# Patient Record
Sex: Male | Born: 1995 | Race: Black or African American | Hispanic: No | Marital: Single | Smoking: Never smoker
Health system: Southern US, Community
[De-identification: ages and names within clinical notes are randomized; demographics above are authoritative.]

## PROBLEM LIST (undated history)

## (undated) HISTORY — PX: FRACTURE SURGERY: SHX138

---

## 2018-07-17 ENCOUNTER — Emergency Department (HOSPITAL_COMMUNITY): Payer: BLUE CROSS/BLUE SHIELD

## 2018-07-17 ENCOUNTER — Encounter (HOSPITAL_COMMUNITY): Payer: Self-pay

## 2018-07-17 ENCOUNTER — Other Ambulatory Visit: Payer: Self-pay

## 2018-07-17 ENCOUNTER — Observation Stay (HOSPITAL_COMMUNITY): Payer: BLUE CROSS/BLUE SHIELD | Admitting: Registered Nurse

## 2018-07-17 ENCOUNTER — Observation Stay (HOSPITAL_COMMUNITY)
Admission: EM | Admit: 2018-07-17 | Discharge: 2018-07-18 | Disposition: A | Discharge status: Home / Self Care | Payer: BLUE CROSS/BLUE SHIELD | Attending: General Surgery | Admitting: General Surgery

## 2018-07-17 ENCOUNTER — Encounter (HOSPITAL_COMMUNITY)
Admission: EM | Disposition: A | Discharge status: Home / Self Care | Payer: Self-pay | Source: Home / Self Care | Attending: Emergency Medicine

## 2018-07-17 DIAGNOSIS — K3580 Unspecified acute appendicitis: Principal | ICD-10-CM | POA: Diagnosis present

## 2018-07-17 HISTORY — PX: LAPAROSCOPIC APPENDECTOMY: SHX408

## 2018-07-17 LAB — URINALYSIS, ROUTINE W REFLEX MICROSCOPIC
Bilirubin Urine: NEGATIVE
Glucose, UA: NEGATIVE mg/dL
Hgb urine dipstick: NEGATIVE
Ketones, ur: NEGATIVE mg/dL
Leukocytes,Ua: NEGATIVE
NITRITE: NEGATIVE
Protein, ur: NEGATIVE mg/dL
Specific Gravity, Urine: 1.031 — ABNORMAL HIGH (ref 1.005–1.030)
pH: 8 (ref 5.0–8.0)

## 2018-07-17 LAB — CBC
HCT: 43.4 % (ref 39.0–52.0)
Hemoglobin: 13.6 g/dL (ref 13.0–17.0)
MCH: 28.3 pg (ref 26.0–34.0)
MCHC: 31.3 g/dL (ref 30.0–36.0)
MCV: 90.4 fL (ref 80.0–100.0)
Platelets: 225 10*3/uL (ref 150–400)
RBC: 4.8 MIL/uL (ref 4.22–5.81)
RDW: 13.1 % (ref 11.5–15.5)
WBC: 14.4 10*3/uL — ABNORMAL HIGH (ref 4.0–10.5)
nRBC: 0 % (ref 0.0–0.2)

## 2018-07-17 LAB — BASIC METABOLIC PANEL
Anion gap: 7 (ref 5–15)
BUN: 15 mg/dL (ref 6–20)
CO2: 26 mmol/L (ref 22–32)
Calcium: 9.7 mg/dL (ref 8.9–10.3)
Chloride: 107 mmol/L (ref 98–111)
Creatinine, Ser: 1.08 mg/dL (ref 0.61–1.24)
GFR calc Af Amer: >60 mL/min (ref >60)
GFR calc non Af Amer: >60 mL/min (ref >60)
Glucose, Bld: 118 mg/dL — ABNORMAL HIGH (ref 70–99)
Potassium: 3.8 mmol/L (ref 3.5–5.1)
Sodium: 140 mmol/L (ref 135–145)

## 2018-07-17 SURGERY — APPENDECTOMY, LAPAROSCOPIC
Anesthesia: General | Site: Abdomen

## 2018-07-17 MED ORDER — MEPERIDINE HCL 50 MG/ML IJ SOLN: 6.2500 mg | INTRAMUSCULAR | Status: DC | PRN

## 2018-07-17 MED ORDER — MEPERIDINE HCL 50 MG/ML IJ SOLN
INTRAMUSCULAR | Status: AC
  Filled 2018-07-17: qty 1

## 2018-07-17 MED ORDER — PROPOFOL 10 MG/ML IV BOLUS
INTRAVENOUS | Status: DC | PRN
  Administered 2018-07-17: 170 mg via INTRAVENOUS
  Administered 2018-07-17: 30 mg via INTRAVENOUS

## 2018-07-17 MED ORDER — INFLUENZA VAC SPLIT QUAD 0.5 ML IM SUSY: 0.5000 mL | PREFILLED_SYRINGE | INTRAMUSCULAR | Status: DC

## 2018-07-17 MED ORDER — PROPOFOL 10 MG/ML IV BOLUS
INTRAVENOUS | Status: AC
  Filled 2018-07-17: qty 20

## 2018-07-17 MED ORDER — FENTANYL CITRATE (PF) 250 MCG/5ML IJ SOLN
INTRAMUSCULAR | Status: DC | PRN
  Administered 2018-07-17: 150 ug via INTRAVENOUS
  Administered 2018-07-17 (×2): 50 ug via INTRAVENOUS

## 2018-07-17 MED ORDER — HYDROMORPHONE HCL 1 MG/ML IJ SOLN
1.0000 mg | Freq: Once | INTRAMUSCULAR | Status: AC
  Administered 2018-07-17: 1 mg via INTRAVENOUS
  Filled 2018-07-17: qty 1

## 2018-07-17 MED ORDER — SUGAMMADEX SODIUM 200 MG/2ML IV SOLN
INTRAVENOUS | Status: DC | PRN
  Administered 2018-07-17: 200 mg via INTRAVENOUS

## 2018-07-17 MED ORDER — DIPHENHYDRAMINE HCL 25 MG PO CAPS: 25.0000 mg | ORAL_CAPSULE | Freq: Four times a day (QID) | ORAL | Status: DC | PRN

## 2018-07-17 MED ORDER — PROMETHAZINE HCL 25 MG/ML IJ SOLN: 6.2500 mg | INTRAMUSCULAR | Status: DC | PRN

## 2018-07-17 MED ORDER — ONDANSETRON HCL 4 MG/2ML IJ SOLN
4.0000 mg | Freq: Once | INTRAMUSCULAR | Status: AC
  Administered 2018-07-17: 4 mg via INTRAVENOUS
  Filled 2018-07-17: qty 2

## 2018-07-17 MED ORDER — SUGAMMADEX SODIUM 200 MG/2ML IV SOLN
INTRAVENOUS | Status: AC
  Filled 2018-07-17: qty 2

## 2018-07-17 MED ORDER — HYDROMORPHONE HCL 1 MG/ML IJ SOLN
INTRAMUSCULAR | Status: AC
  Administered 2018-07-17: 0.25 mg via INTRAVENOUS
  Filled 2018-07-17: qty 1

## 2018-07-17 MED ORDER — METOPROLOL TARTRATE 5 MG/5ML IV SOLN: 5.0000 mg | Freq: Four times a day (QID) | INTRAVENOUS | Status: DC | PRN

## 2018-07-17 MED ORDER — DEXAMETHASONE SODIUM PHOSPHATE 10 MG/ML IJ SOLN
INTRAMUSCULAR | Status: DC | PRN
  Administered 2018-07-17: 10 mg via INTRAVENOUS

## 2018-07-17 MED ORDER — ACETAMINOPHEN 650 MG RE SUPP: 650.0000 mg | Freq: Four times a day (QID) | RECTAL | Status: DC | PRN

## 2018-07-17 MED ORDER — RINGERS IRRIGATION IR SOLN
Status: DC | PRN
  Administered 2018-07-17: 1

## 2018-07-17 MED ORDER — KCL IN DEXTROSE-NACL 20-5-0.45 MEQ/L-%-% IV SOLN
INTRAVENOUS | Status: DC
  Administered 2018-07-17: 21:00:00 via INTRAVENOUS
  Filled 2018-07-17: qty 1000

## 2018-07-17 MED ORDER — SUCCINYLCHOLINE CHLORIDE 200 MG/10ML IV SOSY
PREFILLED_SYRINGE | INTRAVENOUS | Status: DC | PRN
  Administered 2018-07-17: 140 mg via INTRAVENOUS

## 2018-07-17 MED ORDER — BUPIVACAINE-EPINEPHRINE 0.25% -1:200000 IJ SOLN
INTRAMUSCULAR | Status: DC | PRN
  Administered 2018-07-17: 30 mL

## 2018-07-17 MED ORDER — SODIUM CHLORIDE 0.9 % IV BOLUS
1000.0000 mL | Freq: Once | INTRAVENOUS | Status: AC
  Administered 2018-07-17: 1000 mL via INTRAVENOUS

## 2018-07-17 MED ORDER — LIDOCAINE 2% (20 MG/ML) 5 ML SYRINGE
INTRAMUSCULAR | Status: DC | PRN
  Administered 2018-07-17: 60 mg via INTRAVENOUS

## 2018-07-17 MED ORDER — ROCURONIUM BROMIDE 10 MG/ML (PF) SYRINGE
PREFILLED_SYRINGE | INTRAVENOUS | Status: DC | PRN
  Administered 2018-07-17: 40 mg via INTRAVENOUS

## 2018-07-17 MED ORDER — ACETAMINOPHEN 325 MG PO TABS: 650.0000 mg | ORAL_TABLET | Freq: Four times a day (QID) | ORAL | Status: DC | PRN

## 2018-07-17 MED ORDER — LIDOCAINE 2% (20 MG/ML) 5 ML SYRINGE
INTRAMUSCULAR | Status: AC
  Filled 2018-07-17: qty 5

## 2018-07-17 MED ORDER — ACETAMINOPHEN 10 MG/ML IV SOLN
1000.0000 mg | Freq: Once | INTRAVENOUS | Status: DC | PRN
  Administered 2018-07-17: 1000 mg via INTRAVENOUS

## 2018-07-17 MED ORDER — DEXAMETHASONE SODIUM PHOSPHATE 10 MG/ML IJ SOLN
INTRAMUSCULAR | Status: AC
  Filled 2018-07-17: qty 1

## 2018-07-17 MED ORDER — LACTATED RINGERS IV SOLN: INTRAVENOUS | Status: DC

## 2018-07-17 MED ORDER — MIDAZOLAM HCL 2 MG/2ML IJ SOLN
INTRAMUSCULAR | Status: AC
  Filled 2018-07-17: qty 2

## 2018-07-17 MED ORDER — ACETAMINOPHEN 325 MG PO TABS: 325.0000 mg | ORAL_TABLET | Freq: Once | ORAL | Status: DC

## 2018-07-17 MED ORDER — ROCURONIUM BROMIDE 10 MG/ML (PF) SYRINGE
PREFILLED_SYRINGE | INTRAVENOUS | Status: AC
  Filled 2018-07-17: qty 10

## 2018-07-17 MED ORDER — ONDANSETRON HCL 4 MG/2ML IJ SOLN
INTRAMUSCULAR | Status: DC | PRN
  Administered 2018-07-17: 4 mg via INTRAVENOUS

## 2018-07-17 MED ORDER — DIPHENHYDRAMINE HCL 50 MG/ML IJ SOLN: 25.0000 mg | Freq: Four times a day (QID) | INTRAMUSCULAR | Status: DC | PRN

## 2018-07-17 MED ORDER — FENTANYL CITRATE (PF) 250 MCG/5ML IJ SOLN
INTRAMUSCULAR | Status: AC
  Filled 2018-07-17: qty 5

## 2018-07-17 MED ORDER — SUCCINYLCHOLINE CHLORIDE 200 MG/10ML IV SOSY
PREFILLED_SYRINGE | INTRAVENOUS | Status: AC
  Filled 2018-07-17: qty 10

## 2018-07-17 MED ORDER — ONDANSETRON 4 MG PO TBDP: 4.0000 mg | ORAL_TABLET | Freq: Four times a day (QID) | ORAL | Status: DC | PRN

## 2018-07-17 MED ORDER — MIDAZOLAM HCL 5 MG/5ML IJ SOLN
INTRAMUSCULAR | Status: DC | PRN
  Administered 2018-07-17: 2 mg via INTRAVENOUS

## 2018-07-17 MED ORDER — ACETAMINOPHEN 160 MG/5ML PO SOLN: 325.0000 mg | Freq: Once | ORAL | Status: DC

## 2018-07-17 MED ORDER — OXYCODONE HCL 5 MG PO TABS: 5.0000 mg | ORAL_TABLET | Freq: Four times a day (QID) | ORAL | Status: DC | PRN

## 2018-07-17 MED ORDER — SODIUM CHLORIDE 0.9 % IV SOLN
INTRAVENOUS | Status: DC
  Administered 2018-07-17 (×2): via INTRAVENOUS

## 2018-07-17 MED ORDER — BUPIVACAINE-EPINEPHRINE (PF) 0.25% -1:200000 IJ SOLN
INTRAMUSCULAR | Status: AC
  Filled 2018-07-17: qty 30

## 2018-07-17 MED ORDER — MORPHINE SULFATE (PF) 2 MG/ML IV SOLN
2.0000 mg | INTRAVENOUS | Status: DC | PRN
  Administered 2018-07-17 – 2018-07-18 (×2): 2 mg via INTRAVENOUS
  Filled 2018-07-17 (×2): qty 1

## 2018-07-17 MED ORDER — METRONIDAZOLE IN NACL 5-0.79 MG/ML-% IV SOLN
500.0000 mg | Freq: Three times a day (TID) | INTRAVENOUS | Status: DC
  Administered 2018-07-17: 500 mg via INTRAVENOUS
  Filled 2018-07-17: qty 100

## 2018-07-17 MED ORDER — SODIUM CHLORIDE 0.9 % IV SOLN
2.0000 g | INTRAVENOUS | Status: DC
  Administered 2018-07-17: 2 g via INTRAVENOUS
  Filled 2018-07-17: qty 20

## 2018-07-17 MED ORDER — ONDANSETRON HCL 4 MG/2ML IJ SOLN: 4.0000 mg | Freq: Four times a day (QID) | INTRAMUSCULAR | Status: DC | PRN

## 2018-07-17 MED ORDER — ACETAMINOPHEN 10 MG/ML IV SOLN
INTRAVENOUS | Status: AC
  Administered 2018-07-17: 1000 mg via INTRAVENOUS
  Filled 2018-07-17: qty 100

## 2018-07-17 MED ORDER — ONDANSETRON HCL 4 MG/2ML IJ SOLN
INTRAMUSCULAR | Status: AC
  Filled 2018-07-17: qty 2

## 2018-07-17 MED ORDER — POLYETHYLENE GLYCOL 3350 17 G PO PACK: 17.0000 g | PACK | Freq: Every day | ORAL | Status: DC | PRN

## 2018-07-17 MED ORDER — HYDROMORPHONE HCL 1 MG/ML IJ SOLN
0.2500 mg | INTRAMUSCULAR | Status: DC | PRN
  Administered 2018-07-17 (×2): 0.25 mg via INTRAVENOUS

## 2018-07-17 MED ORDER — SODIUM CHLORIDE 0.9 % IR SOLN
Status: DC | PRN
  Administered 2018-07-17: 1000 mL

## 2018-07-17 SURGICAL SUPPLY — 42 items
APPLIER CLIP 5 13 M/L LIGAMAX5 (MISCELLANEOUS)
APPLIER CLIP ROT 10 11.4 M/L (STAPLE)
BANDAGE ADH SHEER 1  50/CT (GAUZE/BANDAGES/DRESSINGS) IMPLANT
BENZOIN TINCTURE PRP APPL 2/3 (GAUZE/BANDAGES/DRESSINGS) ×6 IMPLANT
CABLE HIGH FREQUENCY MONO STRZ (ELECTRODE) ×6 IMPLANT
CLIP APPLIE 5 13 M/L LIGAMAX5 (MISCELLANEOUS) IMPLANT
CLIP APPLIE ROT 10 11.4 M/L (STAPLE) IMPLANT
CLIP VESOLOCK XL 6/CT (CLIP) ×6 IMPLANT
CLOSURE WOUND 1/2 X4 (GAUZE/BANDAGES/DRESSINGS)
CLOSURE WOUND 1/4X4 (GAUZE/BANDAGES/DRESSINGS)
COVER SURGICAL LIGHT HANDLE (MISCELLANEOUS) ×3 IMPLANT
COVER WAND RF STERILE (DRAPES) IMPLANT
DECANTER SPIKE VIAL GLASS SM (MISCELLANEOUS) ×3 IMPLANT
DRAIN CHANNEL 19F RND (DRAIN) IMPLANT
DRAPE LAPAROSCOPIC ABDOMINAL (DRAPES) ×3 IMPLANT
ELECT REM PT RETURN 15FT ADLT (MISCELLANEOUS) ×3 IMPLANT
ENDOLOOP SUT PDS II  0 18 (SUTURE)
ENDOLOOP SUT PDS II 0 18 (SUTURE) IMPLANT
EVACUATOR SILICONE 100CC (DRAIN) IMPLANT
GLOVE BIOGEL PI IND STRL 7.0 (GLOVE) ×1 IMPLANT
GLOVE BIOGEL PI INDICATOR 7.0 (GLOVE) ×2
GLOVE SURG SS PI 7.0 STRL IVOR (GLOVE) ×3 IMPLANT
GOWN STRL REUS W/TWL LRG LVL3 (GOWN DISPOSABLE) ×3 IMPLANT
GOWN STRL REUS W/TWL XL LVL3 (GOWN DISPOSABLE) IMPLANT
GRASPER SUT TROCAR 14GX15 (MISCELLANEOUS) IMPLANT
KIT BASIN OR (CUSTOM PROCEDURE TRAY) ×3 IMPLANT
KIT TURNOVER KIT A (KITS) IMPLANT
POUCH RETRIEVAL ECOSAC 10 (ENDOMECHANICALS) ×2 IMPLANT
POUCH RETRIEVAL ECOSAC 10MM (ENDOMECHANICALS) ×4
SCISSORS LAP 5X35 DISP (ENDOMECHANICALS) ×3 IMPLANT
SET IRRIG TUBING LAPAROSCOPIC (IRRIGATION / IRRIGATOR) ×3 IMPLANT
SET TUBE SMOKE EVAC HIGH FLOW (TUBING) ×3 IMPLANT
SLEEVE XCEL OPT CAN 5 100 (ENDOMECHANICALS) ×3 IMPLANT
STRIP CLOSURE SKIN 1/2X4 (GAUZE/BANDAGES/DRESSINGS) IMPLANT
STRIP CLOSURE SKIN 1/4X4 (GAUZE/BANDAGES/DRESSINGS) IMPLANT
SUT ETHILON 2 0 PS N (SUTURE) IMPLANT
SUT MNCRL AB 4-0 PS2 18 (SUTURE) ×3 IMPLANT
TOWEL OR 17X26 10 PK STRL BLUE (TOWEL DISPOSABLE) ×3 IMPLANT
TOWEL OR NON WOVEN STRL DISP B (DISPOSABLE) ×3 IMPLANT
TRAY LAPAROSCOPIC (CUSTOM PROCEDURE TRAY) ×3 IMPLANT
TROCAR BLADELESS OPT 5 100 (ENDOMECHANICALS) ×3 IMPLANT
TROCAR XCEL 12X100 BLDLESS (ENDOMECHANICALS) ×3 IMPLANT

## 2018-07-17 NOTE — Plan of Care (Signed)
  Problem: Education: Goal: Knowledge of General Education information will improve Description: Including pain rating scale, medication(s)/side effects and non-pharmacologic comfort measures Outcome: Progressing   Problem: Clinical Measurements: Goal: Respiratory complications will improve Outcome: Progressing   Problem: Clinical Measurements: Goal: Cardiovascular complication will be avoided Outcome: Progressing   

## 2018-07-17 NOTE — Anesthesia Preprocedure Evaluation (Signed)
Anesthesia Evaluation  Patient identified by MRN, date of birth, ID band Patient awake    Reviewed: Allergy & Precautions, NPO status , Patient's Chart, lab work & pertinent test results  Airway Mallampati: I  TM Distance: >3 FB Neck ROM: Full    Dental  (+) Teeth Intact, Dental Advisory Given   Pulmonary neg pulmonary ROS,    breath sounds clear to auscultation       Cardiovascular negative cardio ROS   Rhythm:Regular Rate:Normal     Neuro/Psych negative neurological ROS     GI/Hepatic negative GI ROS, Neg liver ROS,   Endo/Other  negative endocrine ROS  Renal/GU negative Renal ROS     Musculoskeletal negative musculoskeletal ROS (+)   Abdominal Normal abdominal exam  (+)   Peds  Hematology negative hematology ROS (+)   Anesthesia Other Findings   Reproductive/Obstetrics                             Anesthesia Physical Anesthesia Plan  ASA: I and emergent  Anesthesia Plan: General   Post-op Pain Management:    Induction: Intravenous, Rapid sequence and Cricoid pressure planned  PONV Risk Score and Plan: 3 and Ondansetron, Dexamethasone and Midazolam  Airway Management Planned: Oral ETT  Additional Equipment: None  Intra-op Plan:   Post-operative Plan: Extubation in OR  Informed Consent:   Plan Discussed with: CRNA  Anesthesia Plan Comments:         Anesthesia Quick Evaluation

## 2018-07-17 NOTE — ED Triage Notes (Signed)
Transported by [facility] from [facility]--onset of RLQ pain and right flank pain that started this morning at 4 am. +n/v.

## 2018-07-17 NOTE — Progress Notes (Signed)
Pt stable to floor on arrival from PACU. No needs a time of arrival to floor. VS stable overall and abdominal ports wnl.

## 2018-07-17 NOTE — ED Notes (Signed)
Surgeon has not come down to talk to patient about surgery, therefore, consent has not been signed

## 2018-07-17 NOTE — ED Provider Notes (Signed)
[name] Provider Note   CSN: [id number] Arrival date & time: 07/17/18  1126    History   Chief Complaint Chief Complaint  Patient presents with  . Abdominal Pain    HPI Shaun Freeman is a 23 y.o. male.     Patient with acute onset of right lower quadrant this morning at about 4 in the morning.  Felt fine yesterday.  Pain is severe and associated with some diaphoresis and nausea and vomiting and dry heaves.  No history of kidney stones.  No blood in the urine no difficulty urinating no back pain.  Past medical history noncontributory.     History reviewed. No pertinent past medical history.  Patient Active Problem List   Diagnosis Date Noted  . Acute appendicitis 07/17/2018    History reviewed. No pertinent surgical history.      Home Medications    Prior to Admission medications   Not on File    Family History History reviewed. No pertinent family history.  Social History Social History   Tobacco Use  . Smoking status: Not on file  Substance Use Topics  . Alcohol use: Not on file  . Drug use: Not on file     Allergies   Patient has no known allergies.   Review of Systems Review of Systems  Constitutional: Negative for chills and fever.  HENT: Negative for rhinorrhea and sore throat.   Eyes: Negative for visual disturbance.  Respiratory: Negative for cough and shortness of breath.   Cardiovascular: Negative for chest pain and leg swelling.  Gastrointestinal: Positive for abdominal pain, nausea and vomiting. Negative for diarrhea.  Genitourinary: Negative for dysuria.  Musculoskeletal: Negative for back pain and neck pain.  Skin: Negative for rash.  Neurological: Negative for dizziness, light-headedness and headaches.  Hematological: Does not bruise/bleed easily.  Psychiatric/Behavioral: Negative for confusion.     Physical Exam Updated Vital Signs BP (!) 169/56   Pulse 61   Temp 97.7 F (36.5 C) (Oral)    Resp 16   Ht 1.854 m ( )   Wt 84.8 kg   SpO2 100%   BMI 24.67 kg/m   Physical Exam Vitals signs and nursing note reviewed.  Constitutional:      Appearance: He is well-developed.  HENT:     Head: Normocephalic and atraumatic.     Mouth/Throat:     Mouth: Mucous membranes are moist.  Eyes:     Conjunctiva/sclera: Conjunctivae normal.     Pupils: Pupils are equal, round, and reactive to light.  Neck:     Musculoskeletal: Normal range of motion and neck supple.  Cardiovascular:     Rate and Rhythm: Normal rate and regular rhythm.     Heart sounds: No murmur.  Pulmonary:     Effort: Pulmonary effort is normal. No respiratory distress.     Breath sounds: Normal breath sounds.  Abdominal:     Palpations: Abdomen is soft.     Tenderness: There is abdominal tenderness.     Comments: Tender to palpation right lower quadrant.  Musculoskeletal: Normal range of motion.        General: No swelling.  Skin:    General: Skin is warm and dry.     Capillary Refill: Capillary refill takes less than 2 seconds.  Neurological:     General: No focal deficit present.     Mental Status: He is alert and oriented to person, place, and time.      ED Treatments /  Results  Labs (all labs ordered are listed, but only abnormal results are displayed) Labs Reviewed  CBC - Abnormal; Notable for the following components:      Result Value   WBC 14.4 (*)    All other components within normal limits  BASIC METABOLIC PANEL - Abnormal; Notable for the following components:   Glucose, Bld 118 (*)    All other components within normal limits  URINALYSIS, ROUTINE W REFLEX MICROSCOPIC - Abnormal; Notable for the following components:   Specific Gravity, Urine 1.031 (*)    All other components within normal limits  HIV ANTIBODY (ROUTINE TESTING W REFLEX)    EKG None  Radiology Ct Renal Stone Study  Result Date: 07/17/2018 CLINICAL DATA:  Onset right flank and right lower quadrant pain at 4  a.m. this morning with nausea and vomiting. EXAM: CT ABDOMEN AND PELVIS WITHOUT CONTRAST TECHNIQUE: Multidetector CT imaging of the abdomen and pelvis was performed following the standard protocol without IV contrast. COMPARISON:  None. FINDINGS: Lower chest: Lung bases are clear. No pleural or pericardial effusion. Hepatobiliary: No focal liver abnormality is seen. No gallstones, gallbladder wall thickening, or biliary dilatation. Pancreas: Unremarkable. No pancreatic ductal dilatation or surrounding inflammatory changes. Spleen: Normal in size without focal abnormality. Adrenals/Urinary Tract: Adrenal glands are unremarkable. Kidneys are normal, without renal calculi, focal lesion, or hydronephrosis. Bladder is unremarkable. Stomach/Bowel: The appendix is dilated with periappendiceal inflammatory change. Two appendicoliths are identified; the larger measures 0.7 cm in diameter. The stomach and small and large bowel appear normal. Vascular/Lymphatic: No significant vascular findings are present. No enlarged abdominal or pelvic lymph nodes. Reproductive: Prostate is unremarkable. Other: Trace amount of free pelvic fluid noted. Musculoskeletal: Negative. IMPRESSION: Acute appendicitis. These results were called by telephone at the time of interpretation on 07/17/2018 at 1:53 pm to Dr. Vanetta Mulders , who verbally acknowledged these results. Electronically Signed   By: Drusilla Kanner M.D.   On: 07/17/2018 13:56    Procedures Procedures (including critical care time)  Medications Ordered in ED Medications  0.9 %  sodium chloride infusion ( Intravenous New Bag/Given 07/17/18 1607)  dextrose 5 % and 0.45 % NaCl with KCl 20 mEq/L infusion (has no administration in time range)  acetaminophen (TYLENOL) tablet 650 mg ( Oral MAR Hold 07/17/18 1625)    Or  acetaminophen (TYLENOL) suppository 650 mg ( Rectal MAR Hold 07/17/18 1625)  oxyCODONE (Oxy IR/ROXICODONE) immediate release tablet 5 mg ( Oral MAR Hold 07/17/18  1625)  morphine 2 MG/ML injection 2 mg ( Intravenous MAR Hold 07/17/18 1625)  polyethylene glycol (MIRALAX / GLYCOLAX) packet 17 g ( Oral MAR Hold 07/17/18 1625)  ondansetron (ZOFRAN-ODT) disintegrating tablet 4 mg ( Oral MAR Hold 07/17/18 1625)    Or  ondansetron (ZOFRAN) injection 4 mg ( Intravenous MAR Hold 07/17/18 1625)  metoprolol tartrate (LOPRESSOR) injection 5 mg ( Intravenous MAR Hold 07/17/18 1625)  cefTRIAXone (ROCEPHIN) 2 g in sodium chloride 0.9 % 100 mL IVPB ( Intravenous Automatically Held 07/25/18 1600)    And  metroNIDAZOLE (FLAGYL) IVPB 500 mg ( Intravenous Automatically Held 07/25/18 1600)  diphenhydrAMINE (BENADRYL) capsule 25 mg ( Oral MAR Hold 07/17/18 1625)    Or  diphenhydrAMINE (BENADRYL) injection 25 mg ( Intravenous MAR Hold 07/17/18 1625)  sodium chloride irrigation 0.9 % (1,000 mLs Irrigation Given 07/17/18 1652)  ringers irrigation (1 application Irrigation Given 07/17/18 1652)  ondansetron (ZOFRAN) injection 4 mg (4 mg Intravenous Given 07/17/18 1303)  sodium chloride 0.9 % bolus 1,000 mL (  1,000 mLs Intravenous New Bag/Given (Non-Interop) 07/17/18 1306)  HYDROmorphone (DILAUDID) injection 1 mg (1 mg Intravenous Given 07/17/18 1303)     Initial Impression / Assessment and Plan / ED Course  I have reviewed the triage vital signs and the nursing notes.  Pertinent labs & imaging results that were available during my care of the patient were reviewed by me and considered in my medical decision making (see chart for details).       Patient CT scan consistent with acute appendicitis without any abscess or rupture.  General surgery contacted they will take patient to the operating room.  Clinically thought patient would maybe have a kidney stone because of the severity of the pain and the the sudden onset of the right lower quadrant domino pain.  But CT scan showed that it is consistent with acute appendicitis.   Final Clinical Impressions(s) / ED Diagnoses   Final  diagnoses:  Acute appendicitis, unspecified acute appendicitis type    ED Discharge Orders    None       Vanetta Mulders, MD 07/17/18 1701

## 2018-07-17 NOTE — Op Note (Signed)
Preoperative diagnosis: acute appendicitis with peritonitis  Postoperative diagnosis: Same   Procedure: laparoscopic appendectomy  Surgeon: Feliciana Rossetti, M.D.  Asst: none  Anesthesia: Gen.   Indications for procedure: Shaun Freeman is a 23 y.o. male with symptoms of pain in right lower quadrant consistent with acute appendicitis. Confirmed by CT scan and laboratory values.  Description of procedure: The patient was brought into the operative suite, placed supine. Anesthesia was administered with endotracheal tube. The patient's left arm was tucked. All pressure points were offloaded by foam padding. The patient was prepped and draped in the usual sterile fashion.  A transverse incision was made to the left of the umbilicus and a 53mm trocar was [place]. Pneumoperitoneum was applied with high flow low pressure.  2 26mm trocars were placed, one in the suprapubic space, one in the LLQ, the periumbilical incision was then up-sized and a 53mm trocar placed in that space. A transversus abdominal block was placed on the left and right sides. Next, the patient was placed in trendelenberg, rotated to the left. The omentum was retracted cephalad. The cecum and appendix were identified. The appendix was inflamed and distended but no signs of rupture. The base of the appendix was dissected and a window through the mesoappendix was created with blunt dissection. Large Hem-o-lock clips were used to doubly ligate the base of the appendix and mesoappendix. The appendix was cut free with scissors. 2 clips were used on the mesoappendix.  The appendix was placed in a specimen bag. The pelvis and RLQ were irrigated. The appendix was removed via the umbilicus. 0 vicryl was used to close the fascial defect. Pneumoperitoneum was removed, all trocars were removed. All incisions were closed with 4-0 monocryl subcuticular stitch. The patient woke from anesthesia and was brought to PACU in stable condition.  Findings:  acute appendicitis  Specimen: appendix  Blood loss: 30 ml  Local anesthesia: 30 ml marcaine  Complications: none  Images:     Feliciana Rossetti, M.D. General, Bariatric, & Minimally Invasive Surgery [facility]

## 2018-07-17 NOTE — Transfer of Care (Signed)
Immediate Anesthesia Transfer of Care Note  Patient: Shaun Freeman  Procedure(s) Performed: APPENDECTOMY LAPAROSCOPIC (N/A Abdomen)  Patient Location: PACU  Anesthesia Type:General  Level of Consciousness: sedated  Airway & Oxygen Therapy: Patient Spontanous Breathing and Patient connected to face mask oxygen  Post-op Assessment: Report given to RN and Post -op Vital signs reviewed and stable  Post vital signs: Reviewed and stable  Last Vitals:  Vitals Value Taken Time  BP    Temp    Pulse 80 07/17/2018  5:26 PM  Resp 20 07/17/2018  5:26 PM  SpO2 100 % 07/17/2018  5:26 PM  Vitals shown include unvalidated device data.  Last Pain:  Vitals:   07/17/18 1357  TempSrc:   PainSc: 3          Complications: No apparent anesthesia complications

## 2018-07-17 NOTE — ED Notes (Signed)
Bed: YK59 Expected date:  Expected time:  Means of arrival:  Comments: EMS Flank/groin pain

## 2018-07-17 NOTE — H&P (Signed)
Reason for Consult: abdominal pain Referring Physician: Vanetta Mulders  Shaun Freeman is an 23 y.o. male.  HPI: 23 yo male woke up from pain this morning. Pain is in right lower quadrant. It is constant. It does not radiate. He had nausea earlier and vomited one time. He denies fevers, chills, or diarrhea. Pain is worse with movement and he has trouble standing straight up. Yesterday he was fine and ran 3 miles and did 100 push ups.  History reviewed. No pertinent past medical history.  History reviewed. No pertinent surgical history.  History reviewed. No pertinent family history.  Social History:  has no history on file for tobacco, alcohol, and drug.  Allergies: No Known Allergies  Medications: I have reviewed the patient's current medications.  Results for orders placed or performed during the hospital encounter of 07/17/18 (from the past 48 hour(s))  CBC     Status: Abnormal   Collection Time: 07/17/18  1:08 PM  Result Value Ref Range   WBC 14.4 (H) 4.0 - 10.5 K/uL   RBC 4.80 4.22 - 5.81 MIL/uL   Hemoglobin 13.6 13.0 - 17.0 g/dL   HCT 49.4 49.6 - 75.9 %   MCV 90.4 80.0 - 100.0 fL   MCH 28.3 26.0 - 34.0 pg   MCHC 31.3 30.0 - 36.0 g/dL   RDW 16.3 84.6 - 65.9 %   Platelets 225 150 - 400 K/uL   nRBC 0.0 0.0 - 0.2 %    Comment: Performed at [facility], [address] [place], [place] [address]  Basic metabolic panel     Status: Abnormal   Collection Time: 07/17/18  1:08 PM  Result Value Ref Range   Sodium 140 135 - 145 mmol/L   Potassium 3.8 3.5 - 5.1 mmol/L   Chloride 107 98 - 111 mmol/L   CO2 26 22 - 32 mmol/L   Glucose, Bld 118 (H) 70 - 99 mg/dL   BUN 15 6 - 20 mg/dL   Creatinine, Ser 1.77 0.61 - 1.24 mg/dL   Calcium 9.7 8.9 - 93.9 mg/dL   GFR calc non Af Amer >60 >60 mL/min   GFR calc Af Amer >60 >60 mL/min   Anion gap 7 5 - 15    Comment: Performed at [facility], [address] [place], [place] [address]   Urinalysis, Routine w reflex microscopic     Status: Abnormal   Collection Time: 07/17/18  1:08 PM  Result Value Ref Range   Color, Urine YELLOW YELLOW   APPearance CLEAR CLEAR   Specific Gravity, Urine 1.031 (H) 1.005 - 1.030   pH 8.0 5.0 - 8.0   Glucose, UA NEGATIVE NEGATIVE mg/dL   Hgb urine dipstick NEGATIVE NEGATIVE   Bilirubin Urine NEGATIVE NEGATIVE   Ketones, ur NEGATIVE NEGATIVE mg/dL   Protein, ur NEGATIVE NEGATIVE mg/dL   Nitrite NEGATIVE NEGATIVE   Leukocytes,Ua NEGATIVE NEGATIVE    Comment: Performed at [facility], [address], [place], [place] [address]    Ct Renal Stone Study  Result Date: 07/17/2018 CLINICAL DATA:  Onset right flank and right lower quadrant pain at 4 a.m. this morning with nausea and vomiting. EXAM: CT ABDOMEN AND PELVIS WITHOUT CONTRAST TECHNIQUE: Multidetector CT imaging of the abdomen and pelvis was performed following the standard protocol without IV contrast. COMPARISON:  None. FINDINGS: Lower chest: Lung bases are clear. No pleural or pericardial effusion. Hepatobiliary: No focal liver abnormality is seen. No gallstones, gallbladder wall thickening,  or biliary dilatation. Pancreas: Unremarkable. No pancreatic ductal dilatation or surrounding inflammatory changes. Spleen: Normal in size without focal abnormality. Adrenals/Urinary Tract: Adrenal glands are unremarkable. Kidneys are normal, without renal calculi, focal lesion, or hydronephrosis. Bladder is unremarkable. Stomach/Bowel: The appendix is dilated with periappendiceal inflammatory change. Two appendicoliths are identified; the larger measures 0.7 cm in diameter. The stomach and small and large bowel appear normal. Vascular/Lymphatic: No significant vascular findings are present. No enlarged abdominal or pelvic lymph nodes. Reproductive: Prostate is unremarkable. Other: Trace amount of free pelvic fluid noted. Musculoskeletal: Negative. IMPRESSION: Acute appendicitis. These  results were called by telephone at the time of interpretation on 07/17/2018 at 1:53 pm to Dr. Vanetta Mulders , who verbally acknowledged these results. Electronically Signed   By: Drusilla Kanner M.D.   On: 07/17/2018 13:56    Review of Systems  Constitutional: Negative for chills and fever.  HENT: Negative for hearing loss.   Eyes: Negative for blurred vision and double vision.  Respiratory: Negative for cough and hemoptysis.   Cardiovascular: Negative for chest pain and palpitations.  Gastrointestinal: Positive for abdominal pain, nausea and vomiting.  Genitourinary: Negative for dysuria and urgency.  Musculoskeletal: Negative for myalgias and neck pain.  Skin: Negative for itching and rash.  Neurological: Negative for dizziness, tingling and headaches.  Endo/Heme/Allergies: Does not bruise/bleed easily.  Psychiatric/Behavioral: Negative for depression and suicidal ideas.   Blood pressure (!) 169/56, pulse 61, temperature 97.7 F (36.5 C), temperature source Oral, resp. rate 16, SpO2 100 %. Physical Exam  Vitals reviewed. Constitutional: He is oriented to person, place, and time. He appears well-developed and well-nourished.  HENT:  Head: Normocephalic and atraumatic.  Eyes: Pupils are equal, round, and reactive to light. Conjunctivae and EOM are normal.  Neck: Normal range of motion. Neck supple.  Cardiovascular: Normal rate and regular rhythm.  Respiratory: Effort normal and breath sounds normal.  GI: Soft. Bowel sounds are normal. He exhibits no distension. There is abdominal tenderness in the right lower quadrant. There is guarding.  Musculoskeletal: Normal range of motion.  Neurological: He is alert and oriented to person, place, and time.  Skin: Skin is warm and dry.  Psychiatric: He has a normal mood and affect. His behavior is normal.      Assessment/Plan: 23 yo male with acute appendicitis -lap appendectomy -we discussed the details of the procedure; that it would  be done under general anesthesia, that we would attempt to do the procedure laparoscopically. That the appendix would be isolated from the large and small intestine and then ligated and removed. We discussed the reason for this is to avoid rupture and infection and resolve the pains. We discussed risks of infection, abscess, injury to intestines or urinary structures, and need for open incision. He showed good understanding and wanted to proceed. -IV abx -NPO -obs after surgery  De Blanch Kinsinger 07/17/2018, 3:21 PM

## 2018-07-17 NOTE — Anesthesia Procedure Notes (Signed)
Procedure Name: Intubation Date/Time: 07/17/2018 4:33 PM Performed by: Talbot Grumbling, CRNA Pre-anesthesia Checklist: Patient identified, Emergency Drugs available, Suction available and Patient being monitored Patient Re-evaluated:Patient Re-evaluated prior to induction Oxygen Delivery Method: Circle system utilized Preoxygenation: Pre-oxygenation with 100% oxygen Induction Type: IV induction and Cricoid Pressure applied Ventilation: Mask ventilation without difficulty Laryngoscope Size: Mac and 3 Grade View: Grade I Tube type: Oral Tube size: 7.5 mm Number of attempts: 1 Airway Equipment and Method: Stylet Placement Confirmation: ETT inserted through vocal cords under direct vision and breath sounds checked- equal and bilateral Secured at: 22 cm Tube secured with: Tape Dental Injury: Teeth and Oropharynx as per pre-operative assessment

## 2018-07-17 NOTE — Anesthesia Postprocedure Evaluation (Signed)
Anesthesia Post Note  Patient: Byrd Buss  Procedure(s) Performed: APPENDECTOMY LAPAROSCOPIC (N/A Abdomen)     Patient location during evaluation: PACU Anesthesia Type: General Level of consciousness: awake and alert Pain management: pain level controlled Vital Signs Assessment: post-procedure vital signs reviewed and stable Respiratory status: spontaneous breathing, nonlabored ventilation, respiratory function stable and patient connected to nasal cannula oxygen Cardiovascular status: blood pressure returned to baseline and stable Postop Assessment: no apparent nausea or vomiting Anesthetic complications: no    Last Vitals:  Vitals:   07/17/18 1815 07/17/18 1840  BP: 130/68 (!) 151/67  Pulse: 71 68  Resp: 14 15  Temp: 37.1 C 36.9 C  SpO2: 97% 100%    Last Pain:  Vitals:   07/17/18 1849  TempSrc:   PainSc: 3                  Shelton Silvas

## 2018-07-18 LAB — CBC
HCT: 40.3 % (ref 39.0–52.0)
Hemoglobin: 12.6 g/dL — ABNORMAL LOW (ref 13.0–17.0)
MCH: 28.4 pg (ref 26.0–34.0)
MCHC: 31.3 g/dL (ref 30.0–36.0)
MCV: 90.8 fL (ref 80.0–100.0)
Platelets: 197 10*3/uL (ref 150–400)
RBC: 4.44 MIL/uL (ref 4.22–5.81)
RDW: 12.9 % (ref 11.5–15.5)
WBC: 16.6 10*3/uL — ABNORMAL HIGH (ref 4.0–10.5)
nRBC: 0 % (ref 0.0–0.2)

## 2018-07-18 LAB — BASIC METABOLIC PANEL
Anion gap: 9 (ref 5–15)
BUN: 10 mg/dL (ref 6–20)
CO2: 24 mmol/L (ref 22–32)
Calcium: 9 mg/dL (ref 8.9–10.3)
Chloride: 104 mmol/L (ref 98–111)
Creatinine, Ser: 1.05 mg/dL (ref 0.61–1.24)
GFR calc Af Amer: >60 mL/min (ref >60)
GFR calc non Af Amer: >60 mL/min (ref >60)
GLUCOSE: 143 mg/dL — AB (ref 70–99)
Potassium: 3.8 mmol/L (ref 3.5–5.1)
Sodium: 137 mmol/L (ref 135–145)

## 2018-07-18 LAB — HIV ANTIBODY (ROUTINE TESTING W REFLEX): HIV Screen 4th Generation wRfx: NONREACTIVE

## 2018-07-18 MED ORDER — TRAMADOL HCL 50 MG PO TABS: 50.0000 mg | ORAL_TABLET | Freq: Three times a day (TID) | ORAL | 1 refills | Status: DC | PRN

## 2018-07-18 NOTE — Discharge Instructions (Signed)
[facility] - DISCHARGE INSTRUCTIONS TO PATIENT  Return to work on:  Friday, 07/23/2018  Activity:  Driving - May drive in 2 or 3 days, if off pain meds and doing well   Lifting - No lifting more than 15 pounds for 5 days, then no limit  Wound Care:   May shower starting Monday, 3/23  Diet:  As tolerated.  Eat light for the first day or two.  Follow up appointment:  Call Dr. Guerry Minors office [facility]) at [phone] for an appointment in 2 to 3 weeks.        Call before coming to the office - you may have an E visit.  Medications and dosages:  Resume your home medications.  You have a prescription for:  Ultram (at [facility], [address]  Call Dr. Laurice Record or his office  [phone]) if you have:  Temperature greater than 100.4,  Persistent nausea and vomiting,  Severe uncontrolled pain,  Redness, tenderness, or signs of infection (pain, swelling, redness, odor or green/yellow discharge around the site),  Any other questions or concerns you may have after discharge.  In an emergency, call 911 or go to an Emergency Department at a nearby hospital.

## 2018-07-18 NOTE — Progress Notes (Signed)
Patient ambulated the length of one hallway x2, tolerated well, steady gait, voiding adequate amounts, tolerating clears, no n/v, +bsx4, 2 port sites CDI with dermabond, iv fluids infusing without issue, vss, medicated for pain per mar after walk, will monitor.

## 2018-07-18 NOTE — Discharge Summary (Signed)
Physician Discharge Summary  Patient ID:  Shaun Freeman  MRN: [id number]  DOB/AGE: 16-Aug-1995 23 y.o.  Admit date: 07/17/2018 Discharge date: 07/18/2018  Discharge Diagnoses:   Active Problems:   Acute appendicitis  Operation: Procedure(s): APPENDECTOMY LAPAROSCOPIC on 07/17/2018  Discharged Condition: good  Hospital Course: Shaun Freeman is an 23 y.o. male whose primary care physician is Patient, No Pcp Per and who was admitted 07/17/2018 with a chief complaint of  Chief Complaint  Patient presents with  . Abdominal Pain  .   He was brought to the operating room on 07/17/2018 and underwent Lap appendectomy.   He is a Consulting civil engineer at [employer].  His mother lives in [place] and is going to come get him. The discharge instructions were reviewed with the patient.  Consults: None  Significant Diagnostic Studies: Results for orders placed or performed during the hospital encounter of 07/17/18  CBC  Result Value Ref Range   WBC 14.4 (H) 4.0 - 10.5 K/uL   RBC 4.80 4.22 - 5.81 MIL/uL   Hemoglobin 13.6 13.0 - 17.0 g/dL   HCT 28.0 03.4 - 91.7 %   MCV 90.4 80.0 - 100.0 fL   MCH 28.3 26.0 - 34.0 pg   MCHC 31.3 30.0 - 36.0 g/dL   RDW 91.5 05.6 - 97.9 %   Platelets 225 150 - 400 K/uL   nRBC 0.0 0.0 - 0.2 %  Basic metabolic panel  Result Value Ref Range   Sodium 140 135 - 145 mmol/L   Potassium 3.8 3.5 - 5.1 mmol/L   Chloride 107 98 - 111 mmol/L   CO2 26 22 - 32 mmol/L   Glucose, Bld 118 (H) 70 - 99 mg/dL   BUN 15 6 - 20 mg/dL   Creatinine, Ser 4.80 0.61 - 1.24 mg/dL   Calcium 9.7 8.9 - 16.5 mg/dL   GFR calc non Af Amer >60 >60 mL/min   GFR calc Af Amer >60 >60 mL/min   Anion gap 7 5 - 15  Urinalysis, Routine w reflex microscopic  Result Value Ref Range   Color, Urine YELLOW YELLOW   APPearance CLEAR CLEAR   Specific Gravity, Urine 1.031 (H) 1.005 - 1.030   pH 8.0 5.0 - 8.0   Glucose, UA NEGATIVE NEGATIVE mg/dL   Hgb urine dipstick NEGATIVE NEGATIVE   Bilirubin Urine  NEGATIVE NEGATIVE   Ketones, ur NEGATIVE NEGATIVE mg/dL   Protein, ur NEGATIVE NEGATIVE mg/dL   Nitrite NEGATIVE NEGATIVE   Leukocytes,Ua NEGATIVE NEGATIVE  CBC  Result Value Ref Range   WBC 16.6 (H) 4.0 - 10.5 K/uL   RBC 4.44 4.22 - 5.81 MIL/uL   Hemoglobin 12.6 (L) 13.0 - 17.0 g/dL   HCT 53.7 48.2 - 70.7 %   MCV 90.8 80.0 - 100.0 fL   MCH 28.4 26.0 - 34.0 pg   MCHC 31.3 30.0 - 36.0 g/dL   RDW 86.7 54.4 - 92.0 %   Platelets 197 150 - 400 K/uL   nRBC 0.0 0.0 - 0.2 %  Basic metabolic panel  Result Value Ref Range   Sodium 137 135 - 145 mmol/L   Potassium 3.8 3.5 - 5.1 mmol/L   Chloride 104 98 - 111 mmol/L   CO2 24 22 - 32 mmol/L   Glucose, Bld 143 (H) 70 - 99 mg/dL   BUN 10 6 - 20 mg/dL   Creatinine, Ser 1.00 0.61 - 1.24 mg/dL   Calcium 9.0 8.9 - 71.2 mg/dL   GFR calc non Af Amer >60 >  60 mL/min   GFR calc Af Amer >60 >60 mL/min   Anion gap 9 5 - 15    Ct Renal Stone Study  Result Date: 07/17/2018 CLINICAL DATA:  Onset right flank and right lower quadrant pain at 4 a.m. this morning with nausea and vomiting. EXAM: CT ABDOMEN AND PELVIS WITHOUT CONTRAST TECHNIQUE: Multidetector CT imaging of the abdomen and pelvis was performed following the standard protocol without IV contrast. COMPARISON:  None. FINDINGS: Lower chest: Lung bases are clear. No pleural or pericardial effusion. Hepatobiliary: No focal liver abnormality is seen. No gallstones, gallbladder wall thickening, or biliary dilatation. Pancreas: Unremarkable. No pancreatic ductal dilatation or surrounding inflammatory changes. Spleen: Normal in size without focal abnormality. Adrenals/Urinary Tract: Adrenal glands are unremarkable. Kidneys are normal, without renal calculi, focal lesion, or hydronephrosis. Bladder is unremarkable. Stomach/Bowel: The appendix is dilated with periappendiceal inflammatory change. Two appendicoliths are identified; the larger measures 0.7 cm in diameter. The stomach and small and large bowel appear  normal. Vascular/Lymphatic: No significant vascular findings are present. No enlarged abdominal or pelvic lymph nodes. Reproductive: Prostate is unremarkable. Other: Trace amount of free pelvic fluid noted. Musculoskeletal: Negative. IMPRESSION: Acute appendicitis. These results were called by telephone at the time of interpretation on 07/17/2018 at 1:53 pm to Dr. Vanetta Mulders , who verbally acknowledged these results. Electronically Signed   By: Drusilla Kanner M.D.   On: 07/17/2018 13:56    Discharge Exam:  Vitals:   07/17/18 2223 07/18/18 0214  BP: (!) 169/83 (!) 142/73  Pulse: 61 63  Resp: 16 16  Temp: 98.3 F (36.8 C) 98.2 F (36.8 C)  SpO2: 100% 100%    General: WN AA M who is alert and generally healthy appearing.  Lungs: Clear to auscultation and symmetric breath sounds. Heart:  RRR. No murmur or rub. Abdomen: Soft. No mass.  No hernia. Has bowel sounds.  Incisions look good.  Discharge Medications:   Allergies as of 07/18/2018   No Known Allergies     Medication List    TAKE these medications   traMADol 50 MG tablet Commonly known as:  ULTRAM Take 1-2 tablets (50-100 mg total) by mouth every 8 (eight) hours as needed.       Disposition: Discharge disposition: 01-Home or Self Care       Discharge Instructions    Diet - low sodium heart healthy   Complete by:  As directed    Increase activity slowly   Complete by:  As directed          Signed: Ovidio Kin, M.D., [facility] Surgery Office:  [phone]  07/18/2018, 9:06 AM

## 2018-07-18 NOTE — Plan of Care (Signed)

## 2018-07-18 NOTE — Plan of Care (Signed)
Patient discharged home in stable condition. Waiting on mother to pick him up

## 2018-07-19 ENCOUNTER — Encounter (HOSPITAL_COMMUNITY): Payer: Self-pay | Admitting: General Surgery

## 2019-08-02 IMAGING — CT CT RENAL STONE PROTOCOL
2 of 4 series · 16 of 46 positions shown, 18 images · non-contrast
Comparison: None.

CLINICAL DATA: Onset right flank and right lower quadrant pain at 4
a.m. this morning with nausea and vomiting.

EXAM:
CT ABDOMEN AND PELVIS WITHOUT CONTRAST
TECHNIQUE: Multidetector CT imaging of the abdomen and pelvis was performed
following the standard protocol without IV contrast.

[Series 2: axial st · axial · 0.71mm/px · z∈[-517,-102]mm · 13 of 95 slices shown, 15 images]
[im 6/95  soft-tissue]
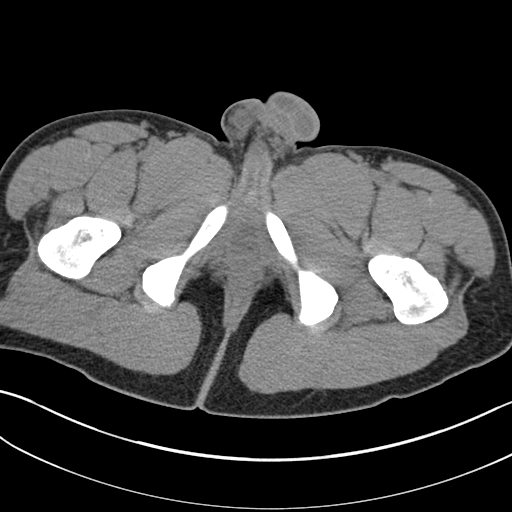
[im 6/95  bone]
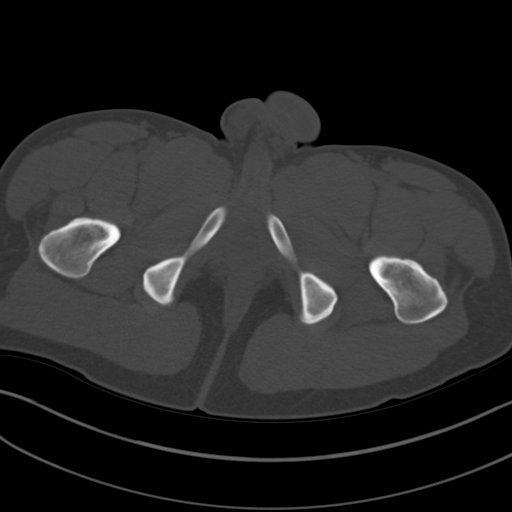
[im 12/95  soft-tissue]
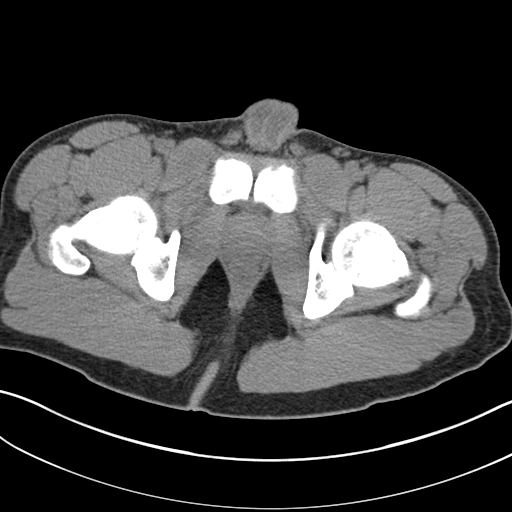
[im 23/95  soft-tissue]
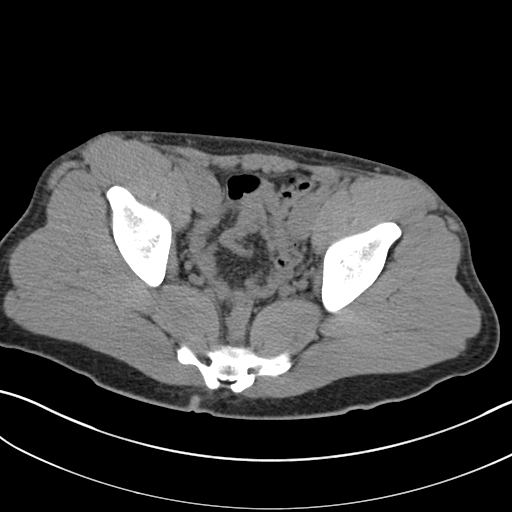
[im 28/95  soft-tissue]
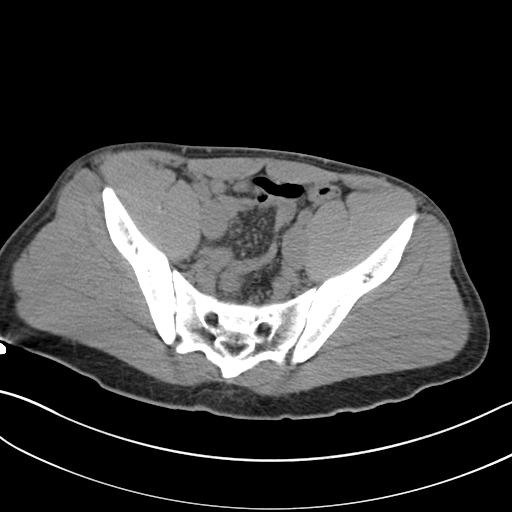
[im 34/95  soft-tissue]
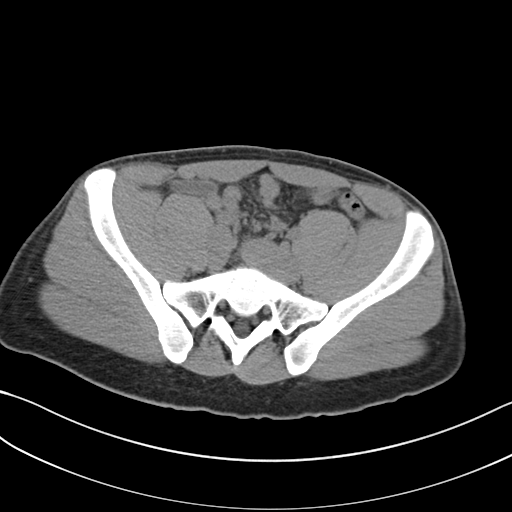
[im 39/95  soft-tissue]
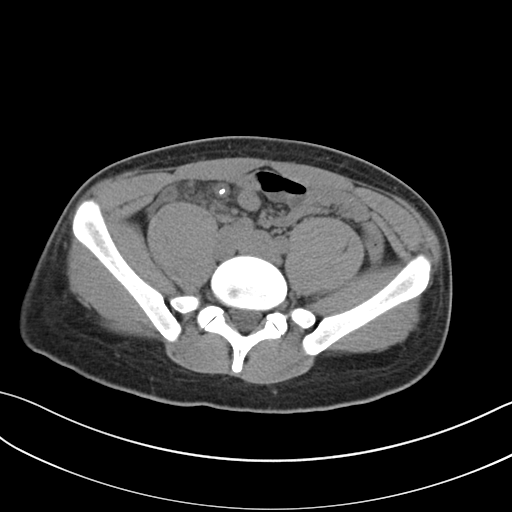
[im 50/95  soft-tissue]
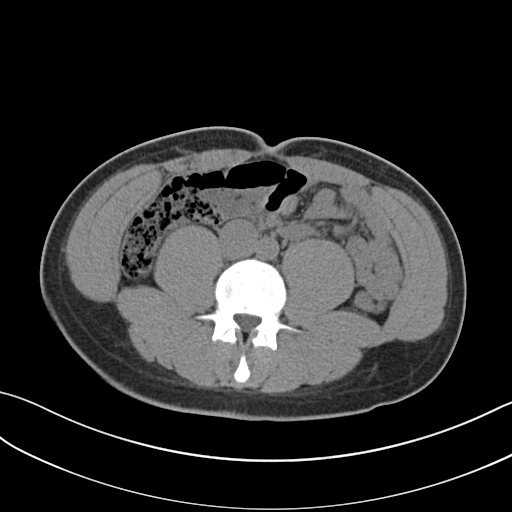
[im 56/95  soft-tissue]
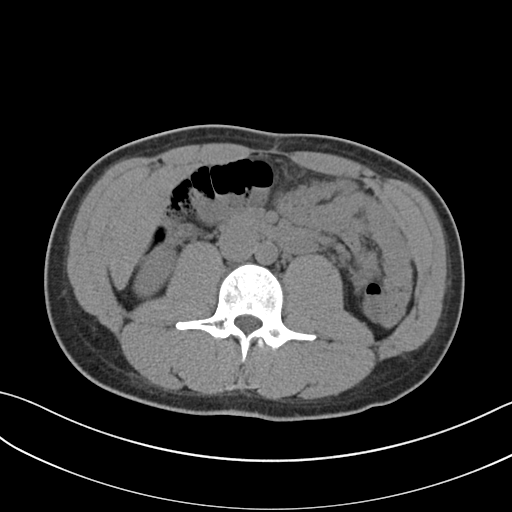
[im 61/95  soft-tissue]
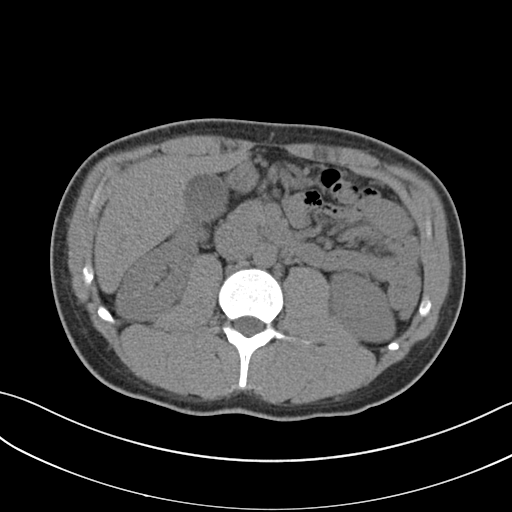
[im 61/95  bone]
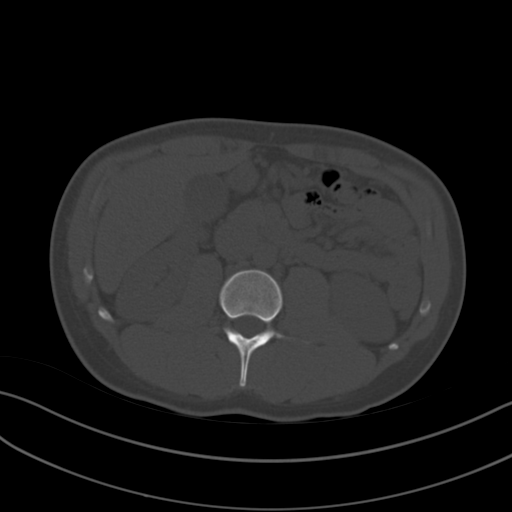
[im 67/95  soft-tissue]
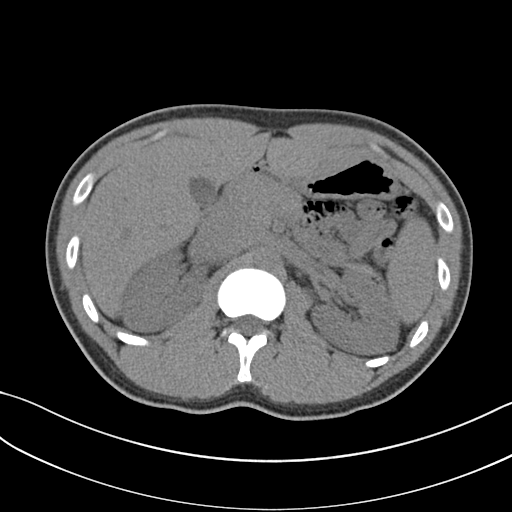
[im 72/95  soft-tissue]
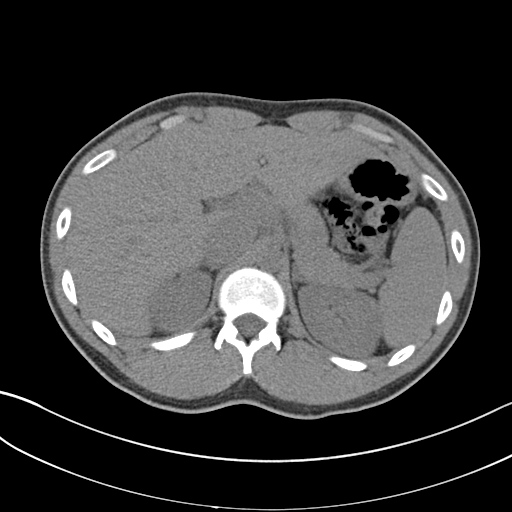
[im 83/95  soft-tissue]
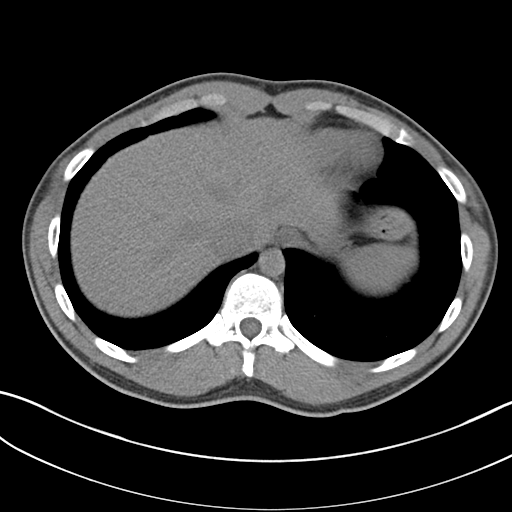
[im 89/95  soft-tissue]
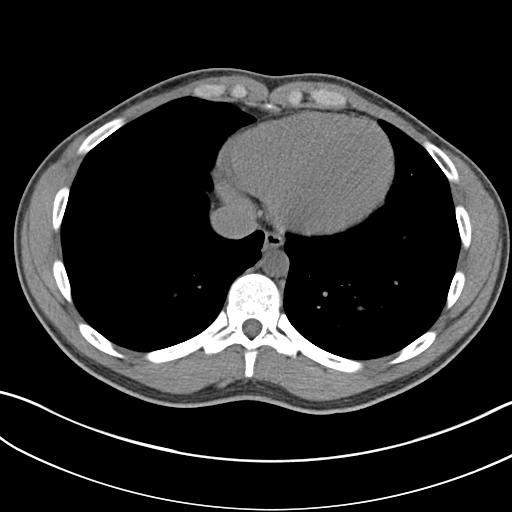

[Series 4: coronal · coronal · 0.72mm/px · 3 of 150 slices shown]
[im 50/150  soft-tissue]
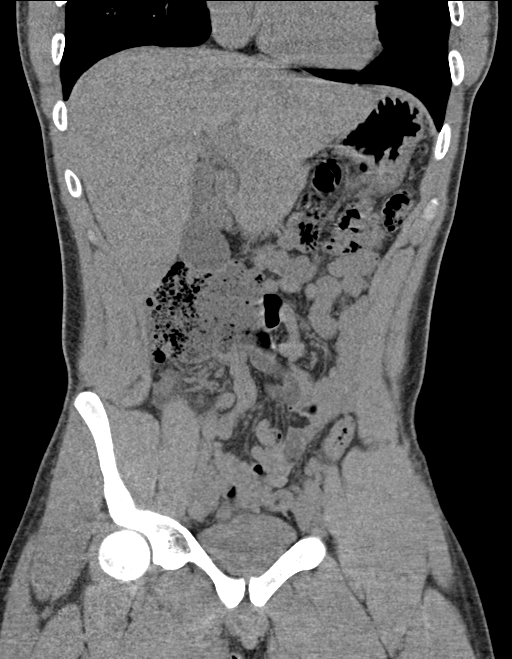
[im 67/150  soft-tissue]
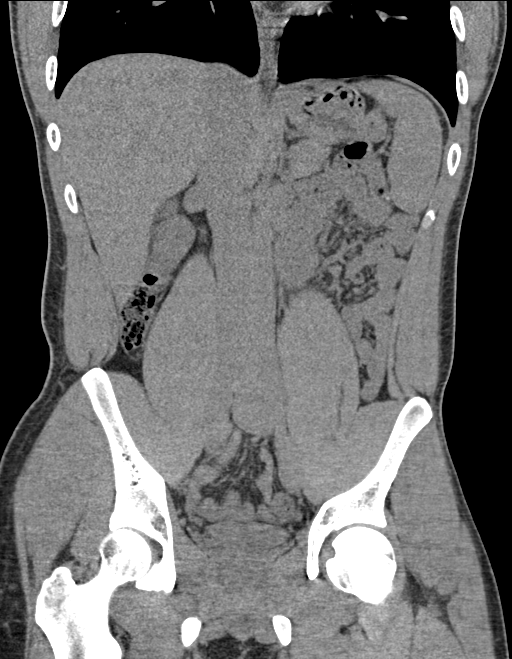
[im 83/150  soft-tissue]
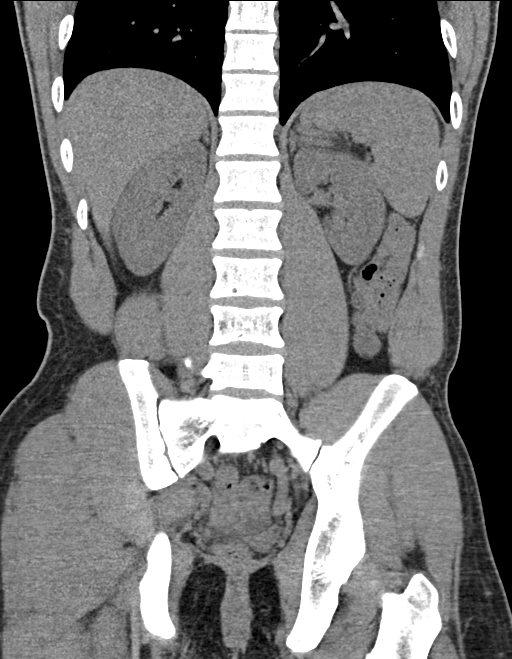

[16 of 46 positions shown; findings below may reference images not displayed]

FINDINGS: Lower chest: Lung bases are clear. No pleural or pericardial
effusion.

Hepatobiliary: No focal liver abnormality is seen. No gallstones,
gallbladder wall thickening, or biliary dilatation.

Pancreas: Unremarkable. No pancreatic ductal dilatation or
surrounding inflammatory changes.

Spleen: Normal in size without focal abnormality.

Adrenals/Urinary Tract: Adrenal glands are unremarkable. Kidneys are
normal, without renal calculi, focal lesion, or hydronephrosis.
Bladder is unremarkable.

Stomach/Bowel: The appendix is dilated with periappendiceal
inflammatory change. Two appendicoliths are identified; the larger
measures 0.7 cm in diameter. The stomach and small and large bowel
appear normal.

Vascular/Lymphatic: No significant vascular findings are present. No
enlarged abdominal or pelvic lymph nodes.

Reproductive: Prostate is unremarkable.

Other: Trace amount of free pelvic fluid noted.

Musculoskeletal: Negative.
IMPRESSION: Acute appendicitis.

These results were called by telephone at the time of interpretation
on 07/17/2018 at [DATE] to Dr. NU LIANG , who verbally
acknowledged these results.

## 2019-11-02 ENCOUNTER — Ambulatory Visit (HOSPITAL_COMMUNITY)
Admission: EM | Admit: 2019-11-02 | Discharge: 2019-11-02 | Disposition: A | Discharge status: Home / Self Care | Payer: 59 | Attending: Emergency Medicine | Admitting: Emergency Medicine

## 2019-11-02 ENCOUNTER — Encounter (HOSPITAL_COMMUNITY): Payer: Self-pay

## 2019-11-02 ENCOUNTER — Other Ambulatory Visit: Payer: Self-pay

## 2019-11-02 DIAGNOSIS — J029 Acute pharyngitis, unspecified: Secondary | ICD-10-CM | POA: Diagnosis not present

## 2019-11-02 DIAGNOSIS — Z20822 Contact with and (suspected) exposure to covid-19: Secondary | ICD-10-CM | POA: Diagnosis not present

## 2019-11-02 DIAGNOSIS — J039 Acute tonsillitis, unspecified: Secondary | ICD-10-CM | POA: Insufficient documentation

## 2019-11-02 LAB — SARS CORONAVIRUS 2 (TAT 6-24 HRS): SARS Coronavirus 2: NEGATIVE

## 2019-11-02 LAB — POCT RAPID STREP A: Streptococcus, Group A Screen (Direct): NEGATIVE

## 2019-11-02 MED ORDER — AMOXICILLIN 500 MG PO CAPS: 500.0000 mg | ORAL_CAPSULE | Freq: Two times a day (BID) | ORAL | 0 refills | Status: AC

## 2019-11-02 NOTE — ED Notes (Signed)
covid swab, labeled and placed in lab

## 2019-11-02 NOTE — ED Provider Notes (Signed)
[facility]    CSN: [id number] Arrival date & time: 11/02/19  0803      History   Chief Complaint Chief Complaint  Patient presents with   Sore Throat    HPI Shaun Freeman is a 24 y.o. male.   Shaun Freeman presents with complaints of sore throat. Started two days ago as scratchy throat. Has not worsened. No fevers. No cough or nasal drainage. Denies gi complaints. Denies any rash or headache. No loss of taste or smell. Has had mono in the past, has had strep in the past as well. No known ill contacts. Has been taking nyquil at night which has helped some with sleep. Has not had covid or had covid vaccination.    ROS per HPI, negative if not otherwise mentioned.      History reviewed. No pertinent past medical history.  Patient Active Problem List   Diagnosis Date Noted   Acute appendicitis 07/17/2018    Past Surgical History:  Procedure Laterality Date   FRACTURE SURGERY Right    arm   LAPAROSCOPIC APPENDECTOMY N/A 07/17/2018   Procedure: APPENDECTOMY LAPAROSCOPIC;  Surgeon: Sheliah Hatch De Blanch, MD;  Location: [facility];  Service: General;  Laterality: N/A;       Home Medications    Prior to Admission medications   Medication Sig Start Date End Date Taking? Authorizing Provider  amoxicillin (AMOXIL) 500 MG capsule Take 1 capsule (500 mg total) by mouth 2 (two) times daily for 10 days. 11/02/19 11/12/19  Georgetta Haber, NP  traMADol (ULTRAM) 50 MG tablet Take 1-2 tablets (50-100 mg total) by mouth every 8 (eight) hours as needed. 07/18/18   Ovidio Kin, MD    Family History Family History  Family history unknown: Yes    Social History Social History   Tobacco Use   Smoking status: Never Smoker   Smokeless tobacco: Never Used  Substance Use Topics   Alcohol use: Never   Drug use: Yes    Frequency: 4.0 times per week    Types: Marijuana    Comment: smokes every other day     Allergies   Patient has no known  allergies.   Review of Systems Review of Systems   Physical Exam Triage Vital Signs ED Triage Vitals  Enc Vitals Group     BP 11/02/19 0826 131/72     Pulse Rate 11/02/19 0826 (!) 59     Resp 11/02/19 0826 18     Temp 11/02/19 0826 98.2 F (36.8 C)     Temp Source 11/02/19 0826 Oral     SpO2 11/02/19 0826 100 %     Weight --      Height --      Head Circumference --      Peak Flow --      Pain Score 11/02/19 0828 5     Pain Loc --      Pain Edu? --      Excl. in GC? --    No data found.  Updated Vital Signs BP 131/72 (BP Location: Left Arm)    Pulse (!) 59    Temp 98.2 F (36.8 C) (Oral)    Resp 18    SpO2 100%    Physical Exam Constitutional:      Appearance: He is well-developed.  HENT:     Nose: No congestion.     Mouth/Throat:     Mouth: Mucous membranes are moist.     Tonsils: Tonsillar exudate present. 3+  on the right. 2+ on the left.  Cardiovascular:     Rate and Rhythm: Normal rate.  Pulmonary:     Effort: Pulmonary effort is normal.  Lymphadenopathy:     Cervical: No cervical adenopathy.  Skin:    General: Skin is warm and dry.  Neurological:     Mental Status: He is alert and oriented to person, place, and time.      UC Treatments / Results  Labs (all labs ordered are listed, but only abnormal results are displayed) Labs Reviewed  SARS CORONAVIRUS 2 (TAT 6-24 HRS)  CULTURE, GROUP A STREP [facility])  POCT RAPID STREP A    EKG   Radiology No results found.  Procedures Procedures (including critical care time)  Medications Ordered in UC Medications - No data to display  Initial Impression / Assessment and Plan / UC Course  I have reviewed the triage vital signs and the nursing notes.  Pertinent labs & imaging results that were available during my care of the patient were reviewed by me and considered in my medical decision making (see chart for details).     Non toxic. Benign physical exam. Negative rapid strep, culture obtained  and pending. covid testing collected and pending. Antibiotics recommended to hold until culture returns and pending symptoms, as does have quite large red swollen exudative tonsils. Afebrile. Return precautions provided. Patient verbalized understanding and agreeable to plan.   Final Clinical Impressions(s) / UC Diagnoses   Final diagnoses:  Tonsillitis     Discharge Instructions     Your rapid strep was negative today which is reassuring. I have sent it to be cultured to confirm this.  We will also test for covid-19 to ensure this is also negative. Typically results in approximately 24 hours.  We will notify of you any positive findings or if any changes to treatment are needed. If normal or otherwise without concern to your results, we will not call you. Please log on to your MyChart to review your results if interested in so.   I have sent antibiotics to the pharmacy. If your symptoms do not continue to improve over the  next few days you may start these. If by Friday you still feel better (and your culture is negative also) you do not need to take, as your symptoms are likely viral in nature and antibiotics would not be helpful.  If symptoms worsen or do not improve in the next week to return to be seen or to follow up with your PCP.      ED Prescriptions    Medication Sig Dispense Auth. Provider   amoxicillin (AMOXIL) 500 MG capsule Take 1 capsule (500 mg total) by mouth 2 (two) times daily for 10 days. 20 capsule Georgetta Haber, NP     PDMP not reviewed this encounter.   Georgetta Haber, NP 11/02/19 [phone]

## 2019-11-02 NOTE — Discharge Instructions (Signed)
Your rapid strep was negative today which is reassuring. I have sent it to be cultured to confirm this.  We will also test for covid-19 to ensure this is also negative. Typically results in approximately 24 hours.  We will notify of you any positive findings or if any changes to treatment are needed. If normal or otherwise without concern to your results, we will not call you. Please log on to your MyChart to review your results if interested in so.   I have sent antibiotics to the pharmacy. If your symptoms do not continue to improve over the  next few days you may start these. If by Friday you still feel better (and your culture is negative also) you do not need to take, as your symptoms are likely viral in nature and antibiotics would not be helpful.  If symptoms worsen or do not improve in the next week to return to be seen or to follow up with your PCP.

## 2019-11-02 NOTE — ED Triage Notes (Signed)
Pt presents with sore throat X 2 days; pt states he has a Hx of Strep and Mono.

## 2019-11-04 LAB — CULTURE, GROUP A STREP (THRC)

## 2021-08-12 ENCOUNTER — Observation Stay (HOSPITAL_BASED_OUTPATIENT_CLINIC_OR_DEPARTMENT_OTHER)
Admission: EM | Admit: 2021-08-12 | Discharge: 2021-08-14 | Disposition: A | Discharge status: Home / Self Care | Payer: Self-pay | Attending: Internal Medicine | Admitting: Internal Medicine

## 2021-08-12 ENCOUNTER — Other Ambulatory Visit: Payer: Self-pay

## 2021-08-12 DIAGNOSIS — I10 Essential (primary) hypertension: Secondary | ICD-10-CM | POA: Insufficient documentation

## 2021-08-12 DIAGNOSIS — E669 Obesity, unspecified: Secondary | ICD-10-CM | POA: Insufficient documentation

## 2021-08-12 DIAGNOSIS — M6282 Rhabdomyolysis: Secondary | ICD-10-CM | POA: Insufficient documentation

## 2021-08-12 DIAGNOSIS — N179 Acute kidney failure, unspecified: Principal | ICD-10-CM | POA: Insufficient documentation

## 2021-08-12 DIAGNOSIS — R7989 Other specified abnormal findings of blood chemistry: Secondary | ICD-10-CM

## 2021-08-12 DIAGNOSIS — D72829 Elevated white blood cell count, unspecified: Secondary | ICD-10-CM | POA: Insufficient documentation

## 2021-08-12 DIAGNOSIS — Z79899 Other long term (current) drug therapy: Secondary | ICD-10-CM | POA: Insufficient documentation

## 2021-08-12 DIAGNOSIS — E860 Dehydration: Secondary | ICD-10-CM | POA: Insufficient documentation

## 2021-08-12 LAB — COMPREHENSIVE METABOLIC PANEL
ALT: 41 U/L (ref 0–44)
AST: 45 U/L — ABNORMAL HIGH (ref 15–41)
Albumin: 5.1 g/dL — ABNORMAL HIGH (ref 3.5–5.0)
Alkaline Phosphatase: 53 U/L (ref 38–126)
Anion gap: 12 (ref 5–15)
BUN: 21 mg/dL — ABNORMAL HIGH (ref 6–20)
CO2: 23 mmol/L (ref 22–32)
Calcium: 10.6 mg/dL — ABNORMAL HIGH (ref 8.9–10.3)
Chloride: 102 mmol/L (ref 98–111)
Creatinine, Ser: 2.5 mg/dL — ABNORMAL HIGH (ref 0.61–1.24)
GFR, Estimated: 36 mL/min — ABNORMAL LOW (ref >60)
Glucose, Bld: 128 mg/dL — ABNORMAL HIGH (ref 70–99)
Potassium: 4.7 mmol/L (ref 3.5–5.1)
Sodium: 137 mmol/L (ref 135–145)
Total Bilirubin: 0.6 mg/dL (ref 0.3–1.2)
Total Protein: 9.2 g/dL — ABNORMAL HIGH (ref 6.5–8.1)

## 2021-08-12 LAB — CK: Total CK: 619 U/L — ABNORMAL HIGH (ref 49–397)

## 2021-08-12 LAB — CBC WITH DIFFERENTIAL/PLATELET
Abs Immature Granulocytes: 0.25 10*3/uL — ABNORMAL HIGH (ref 0.00–0.07)
Basophils Absolute: 0.1 10*3/uL (ref 0.0–0.1)
Basophils Relative: 1 %
Eosinophils Absolute: 0 10*3/uL (ref 0.0–0.5)
Eosinophils Relative: 0 %
HCT: 45.2 % (ref 39.0–52.0)
Hemoglobin: 15.2 g/dL (ref 13.0–17.0)
Immature Granulocytes: 1 %
Lymphocytes Relative: 10 %
Lymphs Abs: 2 10*3/uL (ref 0.7–4.0)
MCH: 29.3 pg (ref 26.0–34.0)
MCHC: 33.6 g/dL (ref 30.0–36.0)
MCV: 87.3 fL (ref 80.0–100.0)
Monocytes Absolute: 1.4 10*3/uL — ABNORMAL HIGH (ref 0.1–1.0)
Monocytes Relative: 7 %
Neutro Abs: 16.1 10*3/uL — ABNORMAL HIGH (ref 1.7–7.7)
Neutrophils Relative %: 81 %
Platelets: 300 10*3/uL (ref 150–400)
RBC: 5.18 MIL/uL (ref 4.22–5.81)
RDW: 12.9 % (ref 11.5–15.5)
WBC: 19.8 10*3/uL — ABNORMAL HIGH (ref 4.0–10.5)
nRBC: 0 % (ref 0.0–0.2)

## 2021-08-12 LAB — MAGNESIUM: Magnesium: 1.8 mg/dL (ref 1.7–2.4)

## 2021-08-12 MED ORDER — SODIUM CHLORIDE 0.9 % IV BOLUS
1000.0000 mL | Freq: Once | INTRAVENOUS | Status: AC
  Administered 2021-08-12: 1000 mL via INTRAVENOUS

## 2021-08-12 MED ORDER — ONDANSETRON HCL 4 MG/2ML IJ SOLN
4.0000 mg | Freq: Once | INTRAMUSCULAR | Status: AC
  Administered 2021-08-12: 4 mg via INTRAVENOUS
  Filled 2021-08-12: qty 2

## 2021-08-12 NOTE — ED Triage Notes (Signed)
Pt. Was in the gym working out and felt body cramps per EMS and then vomited. Pt. Had been playing basketball prior to his work out today for several hours playing basket ball.  After EMS picked Pt. Up at the gym Fluids were started and he was given 163mcs of fentanyl. ?

## 2021-08-12 NOTE — ED Provider Notes (Signed)
?[facility] EM[facility]Y DEPARTMENT ?Provider Note ? ? ?CSN: [id number] ?Arrival date & time: 08/12/21  2040 ? ?  ? ?History ? ?No chief complaint on file. ? ? ?Shaun Freeman is a 26 y.o. male. ? ?The history is provided by the patient and medical records. No language interpreter was used.  ?Leg Pain ?Location:  Leg ?Time since incident:  2 hours ?Injury: no   ?Leg location:  L leg and R leg ?Pain details:  ?  Quality:  Cramping ?  Severity:  No pain ?  Timing:  Intermittent ?  Progression:  Improving ?Chronicity:  New ?Prior injury to area:  No ?Relieved by: fluids with EMS. ?Worsened by:  Nothing ?Associated symptoms: fatigue   ?Associated symptoms: no back pain, no fever and no neck pain   ?Associated symptoms comment:  Diffuse muscle cramping and spasms ? ?  ? ?Home Medications ?Prior to Admission medications   ?Medication Sig Start Date End Date Taking? Authorizing Provider  ?traMADol (ULTRAM) 50 MG tablet Take 1-2 tablets (50-100 mg total) by mouth every 8 (eight) hours as needed. 07/18/18   Alphonsa Overall, MD  ?   ? ?Allergies    ?Patient has no known allergies.   ? ?Review of Systems   ?Review of Systems  ?Constitutional:  Positive for fatigue. Negative for chills and fever.  ?HENT:  Negative for congestion.   ?Eyes:  Negative for visual disturbance.  ?Respiratory:  Negative for cough, chest tightness and shortness of breath.   ?Cardiovascular:  Negative for chest pain, palpitations and leg swelling.  ?Gastrointestinal:  Positive for nausea and vomiting. Negative for abdominal pain, constipation and diarrhea.  ?Genitourinary:  Negative for flank pain.  ?Musculoskeletal:  Positive for myalgias. Negative for back pain, neck pain and neck stiffness.  ?Neurological:  Negative for dizziness, weakness, light-headedness and headaches.  ?Psychiatric/Behavioral:  Negative for agitation and confusion.   ?All other systems reviewed and are negative. ? ?Physical Exam ?Updated Vital Signs ?BP (!) 138/92 (BP  Location: Left Arm)   Pulse 81   Temp 97.6 ?F (36.4 ?C) (Oral)   Resp 12   SpO2 99%  ?Physical Exam ?Vitals and nursing note reviewed.  ?Constitutional:   ?   General: He is not in acute distress. ?   Appearance: He is well-developed. He is not ill-appearing, toxic-appearing or diaphoretic.  ?HENT:  ?   Head: Normocephalic and atraumatic.  ?   Nose: No congestion or rhinorrhea.  ?   Mouth/Throat:  ?   Mouth: Mucous membranes are dry.  ?   Pharynx: No oropharyngeal exudate or posterior oropharyngeal erythema.  ?Eyes:  ?   Extraocular Movements: Extraocular movements intact.  ?   Conjunctiva/sclera: Conjunctivae normal.  ?   Pupils: Pupils are equal, round, and reactive to light.  ?Cardiovascular:  ?   Rate and Rhythm: Normal rate and regular rhythm.  ?   Heart sounds: No murmur heard. ?Pulmonary:  ?   Effort: Pulmonary effort is normal. No respiratory distress.  ?   Breath sounds: Normal breath sounds. No wheezing, rhonchi or rales.  ?Chest:  ?   Chest wall: No tenderness.  ?Abdominal:  ?   General: Abdomen is flat.  ?   Palpations: Abdomen is soft.  ?   Tenderness: There is no abdominal tenderness. There is no right CVA tenderness, left CVA tenderness, guarding or rebound.  ?Musculoskeletal:     ?   General: No swelling or tenderness.  ?   Cervical back: Neck supple.  No tenderness.  ?   Right lower leg: No edema.  ?   Left lower leg: No edema.  ?Skin: ?   General: Skin is warm and dry.  ?   Capillary Refill: Capillary refill takes less Freeman 2 seconds.  ?   Findings: No erythema or rash.  ?Neurological:  ?   General: No focal deficit present.  ?   Mental Status: He is alert.  ?   Sensory: No sensory deficit.  ?   Motor: No weakness.  ?Psychiatric:     ?   Mood and Affect: Mood normal.  ? ? ?ED Results / Procedures / Treatments   ?Labs ?(all labs ordered are listed, but only abnormal results are displayed) ?Labs Reviewed  ?CBC WITH DIFFERENTIAL/PLATELET - Abnormal; Notable for the following components:  ?     Result Value  ? WBC 19.8 (*)   ? Neutro Abs 16.1 (*)   ? Monocytes Absolute 1.4 (*)   ? Abs Immature Granulocytes 0.25 (*)   ? All other components within normal limits  ?COMPREHENSIVE METABOLIC PANEL - Abnormal; Notable for the following components:  ? Glucose, Bld 128 (*)   ? BUN 21 (*)   ? Creatinine, Ser 2.50 (*)   ? Calcium 10.6 (*)   ? Total Protein 9.2 (*)   ? Albumin 5.1 (*)   ? AST 45 (*)   ? GFR, Estimated 36 (*)   ? All other components within normal limits  ?CK - Abnormal; Notable for the following components:  ? Total CK 619 (*)   ? All other components within normal limits  ?MAGNESIUM  ?URINALYSIS, ROUTINE W REFLEX MICROSCOPIC  ? ? ?EKG ?EKG Interpretation ? ?Date/Time:  Monday August 12 2021 20:51:41 EDT ?Ventricular Rate:  83 ?PR Interval:  180 ?QRS Duration: 112 ?QT Interval:  354 ?QTC Calculation: 416 ?R Axis:   20 ?Text Interpretation: Sinus rhythm Incomplete right bundle branch block ST elevation suggests acute pericarditis no prior ECG for comparison. No STEMI Confirmed by Antony Blackbird [phone]) on 08/12/2021 9:28:40 PM ? ?Radiology ?No results found. ? ?Procedures ?Procedures  ? ? ?Medications Ordered in ED ?Medications  ?ondansetron (ZOFRAN) injection 4 mg (4 mg Intravenous Given 08/12/21 2114)  ?sodium chloride 0.9 % bolus 1,000 mL (1,000 mLs Intravenous New Bag/Given 08/12/21 2144)  ? ? ?ED Course/ Medical Decision Making/ A&P ?  ?                        ?Medical Decision Making ?Amount and/or Complexity of Data Reviewed ?Labs: ordered. ? ?Risk ?Prescription drug management. ?Decision regarding hospitalization. ? ? ? ?Shaun Freeman is a 26 y.o. male with no significant past medical history who presents with diffuse muscle cramping, fatigue, feeling dehydrated, with nausea and vomiting.  According to patient, he spent the last week in [place] and got of his normal hydration and exercise regimen.  He reports she works out frequently.  He says that today he uses steam room, exercise, then  played basketball for several hours, ate a small amount of dinner, then played bass ball for several more hours before having cramps in both legs causing him to lay down on the ground, cramps on his torso, arms, neck, and feeling fatigued.  He is felt very dehydrated and dry.  He had some nausea and vomiting.  He says that EMS was concerned about rhabdo and dehydration and gave him some fluids and some fentanyl for muscle pains.  Patient was  brought in for evaluation. ? ?He reports that his cramps were diffuse and had fasciculations.  He denies any seizure activity and was fully conscious during all the episodes. ? ?Patient reports that all week he has had no symptoms and is also had no constipation or diarrhea.  He denies any fevers, chills, congestion, cough, or urinary changes.  He did not describe any sick contacts.  He is already started to feel better after getting some fluids with EMS. ? ?On exam, lungs were clear and chest was nontender.  Abdomen was nontender.  Back nontender.  Neck nontender.  I do not appreciate any fasciculations the patient may have had some spasms in the back.  Oropharynx is dry but no focal neurologic deficits on exam.  Patient otherwise well-appearing.  He is reporting significant thirst. ? ?Clinically I suspect patient is dehydrated from not drinking as much fluid as he normally did and then starting back quickly into an exercise regimen that he had not done for a week.  I suspect that he may have early rhabdo but we will rule out acute electrolyte abnormality.  He denied any history of diabetes and I have less suspicion that this is a first presentation of this.  We will get screening labs, give him some fluids, and continue to monitor. ? ?If work-up is reassuring, and there is no evidence of significant rhabdo or AKI, anticipate he will likely be stable for discharge home. ? ?10:37 PM ?Patient's work-up began to return.  CK is slightly elevated at 619 suggestive of mild rhabdo  however this blood work was collected after the first liter of normal saline by EMS so it could be already improving.  Metabolic panel however was concerning for acute kidney injury with a creatinine of 2.50 and GFR of

## 2021-08-12 NOTE — ED Notes (Signed)
ED Provider at bedside. 

## 2021-08-12 NOTE — ED Notes (Signed)
Patient is resting comfortably. 

## 2021-08-12 NOTE — ED Notes (Signed)
Pt. Is no longer cramping and has not vomited since the zofran was given. ?

## 2021-08-13 ENCOUNTER — Observation Stay (HOSPITAL_COMMUNITY): Payer: Self-pay

## 2021-08-13 ENCOUNTER — Encounter (HOSPITAL_BASED_OUTPATIENT_CLINIC_OR_DEPARTMENT_OTHER): Payer: Self-pay | Admitting: Internal Medicine

## 2021-08-13 DIAGNOSIS — I10 Essential (primary) hypertension: Secondary | ICD-10-CM

## 2021-08-13 DIAGNOSIS — E860 Dehydration: Secondary | ICD-10-CM

## 2021-08-13 DIAGNOSIS — D72829 Elevated white blood cell count, unspecified: Secondary | ICD-10-CM

## 2021-08-13 DIAGNOSIS — R7989 Other specified abnormal findings of blood chemistry: Secondary | ICD-10-CM

## 2021-08-13 DIAGNOSIS — M6282 Rhabdomyolysis: Secondary | ICD-10-CM

## 2021-08-13 DIAGNOSIS — N179 Acute kidney failure, unspecified: Principal | ICD-10-CM

## 2021-08-13 LAB — COMPREHENSIVE METABOLIC PANEL
ALT: 40 U/L (ref 0–44)
AST: 48 U/L — ABNORMAL HIGH (ref 15–41)
Albumin: 4.3 g/dL (ref 3.5–5.0)
Alkaline Phosphatase: 43 U/L (ref 38–126)
Anion gap: 7 (ref 5–15)
BUN: 19 mg/dL (ref 6–20)
CO2: 24 mmol/L (ref 22–32)
Calcium: 9.3 mg/dL (ref 8.9–10.3)
Chloride: 106 mmol/L (ref 98–111)
Creatinine, Ser: 1.46 mg/dL — ABNORMAL HIGH (ref 0.61–1.24)
GFR, Estimated: >60 mL/min (ref >60)
Glucose, Bld: 99 mg/dL (ref 70–99)
Potassium: 4 mmol/L (ref 3.5–5.1)
Sodium: 137 mmol/L (ref 135–145)
Total Bilirubin: 0.7 mg/dL (ref 0.3–1.2)
Total Protein: 7.8 g/dL (ref 6.5–8.1)

## 2021-08-13 LAB — CBC WITH DIFFERENTIAL/PLATELET
Abs Immature Granulocytes: 0.06 10*3/uL (ref 0.00–0.07)
Basophils Absolute: 0 10*3/uL (ref 0.0–0.1)
Basophils Relative: 0 %
Eosinophils Absolute: 0.1 10*3/uL (ref 0.0–0.5)
Eosinophils Relative: 0 %
HCT: 40.9 % (ref 39.0–52.0)
Hemoglobin: 13.5 g/dL (ref 13.0–17.0)
Immature Granulocytes: 0 %
Lymphocytes Relative: 31 %
Lymphs Abs: 4.7 10*3/uL — ABNORMAL HIGH (ref 0.7–4.0)
MCH: 29.9 pg (ref 26.0–34.0)
MCHC: 33 g/dL (ref 30.0–36.0)
MCV: 90.7 fL (ref 80.0–100.0)
Monocytes Absolute: 1.4 10*3/uL — ABNORMAL HIGH (ref 0.1–1.0)
Monocytes Relative: 9 %
Neutro Abs: 8.9 10*3/uL — ABNORMAL HIGH (ref 1.7–7.7)
Neutrophils Relative %: 60 %
Platelets: 247 10*3/uL (ref 150–400)
RBC: 4.51 MIL/uL (ref 4.22–5.81)
RDW: 13.4 % (ref 11.5–15.5)
WBC: 15.2 10*3/uL — ABNORMAL HIGH (ref 4.0–10.5)
nRBC: 0 % (ref 0.0–0.2)

## 2021-08-13 LAB — URINALYSIS, MICROSCOPIC (REFLEX)

## 2021-08-13 LAB — URINALYSIS, ROUTINE W REFLEX MICROSCOPIC
Bilirubin Urine: NEGATIVE
Glucose, UA: NEGATIVE mg/dL
Ketones, ur: 40 mg/dL — AB
Leukocytes,Ua: NEGATIVE
Nitrite: NEGATIVE
Protein, ur: 100 mg/dL — AB
Specific Gravity, Urine: 1.02 (ref 1.005–1.030)
pH: 5.5 (ref 5.0–8.0)

## 2021-08-13 LAB — CK: Total CK: 1086 U/L — ABNORMAL HIGH (ref 49–397)

## 2021-08-13 MED ORDER — ACETAMINOPHEN 325 MG PO TABS: 650.0000 mg | ORAL_TABLET | Freq: Four times a day (QID) | ORAL | Status: DC | PRN

## 2021-08-13 MED ORDER — SODIUM CHLORIDE 0.9 % IV BOLUS
1000.0000 mL | Freq: Once | INTRAVENOUS | Status: AC
  Administered 2021-08-13: 1000 mL via INTRAVENOUS

## 2021-08-13 MED ORDER — ACETAMINOPHEN 650 MG RE SUPP: 650.0000 mg | Freq: Four times a day (QID) | RECTAL | Status: DC | PRN

## 2021-08-13 MED ORDER — HYDRALAZINE HCL 20 MG/ML IJ SOLN
10.0000 mg | Freq: Three times a day (TID) | INTRAMUSCULAR | Status: DC | PRN
Start: 2021-08-13 — End: 2021-08-14

## 2021-08-13 MED ORDER — LACTATED RINGERS IV SOLN: INTRAVENOUS | Status: DC

## 2021-08-13 NOTE — H&P (Signed)
?History and Physical  ? ? ?Patient: Shaun Freeman NKN:[id number] DOB: 04-May-1995 ?DOA: 08/12/2021 ?DOS: the patient was seen and examined on 08/13/2021 ?PCP: Patient, No Pcp Per (Inactive)  ?Patient coming from: Home ? ?Chief Complaint:  ?Chief Complaint  ?Patient presents with  ? Muscle Pain  ? ?HPI: Shaun Freeman is a 26 y.o. male with no significant medical history. Presenting with muscle cramping. He reports that he's had several days of hard working out to catch up for his decrease in exercise during a business trip. Yesterday he was playing basketball and working out all day. In the afternoon, he felt severe cramps in his legs. They progressed to his hips and feet. He felt dehydrated. So he sat down for about 10 minutes. All of the sudden he had severe vomiting. His left leg locked and he had to lay down on the ground. He then started cramping all over his body. He continued to vomit. When he was done, he called out for help. EMS was alerted and he was brought to the ED for evaluation. He denies any supplement use. He denies any other aggravating or alleviating factors.  ? ?Review of Systems: As mentioned in the history of present illness. All other systems reviewed and are negative. ?History reviewed. No pertinent past medical history. ?Past Surgical History:  ?Procedure Laterality Date  ? FRACTURE SURGERY Right   ? arm  ? LAPAROSCOPIC APPENDECTOMY N/A 07/17/2018  ? Procedure: APPENDECTOMY LAPAROSCOPIC;  Surgeon: Kinsinger, De Blanch, MD;  Location: [facility];  Service: General;  Laterality: N/A;  ? ?Social History:  reports that he has never smoked. He has never used smokeless tobacco. He reports current drug use. Frequency: 4.00 times per week. Drug: Marijuana. He reports that he does not drink alcohol. ? ?No Known Allergies ? ?Family History  ?Family history unknown: Yes  ? ? ?Prior to Admission medications   ?Medication Sig Start Date End Date Taking? Authorizing Provider  ?acetaminophen (TYLENOL)  500 MG tablet Take 1,000 mg by mouth every 6 (six) hours as needed for headache.   Yes [provider]  ? ? ?Physical Exam: ?Vitals:  ? 08/13/21 0900 08/13/21 1100 08/13/21 1200 08/13/21 1243  ?BP: (!) 137/58 138/90 (!) 150/74 (!) 171/86  ?Pulse: 64 68 68 61  ?Resp: (!) 23 20 20    ?Temp:  97.8 ?F (36.6 ?C) 97.8 ?F (36.6 ?C) 97.6 ?F (36.4 ?C)  ?TempSrc:    Oral  ?SpO2: 99% 99% 98% 100%  ? ?General: 26 y.o. male resting in bed in NAD ?Eyes: PERRL, normal sclera ?ENMT: Nares patent w/o discharge, orophaynx clear, dentition normal, ears w/o discharge/lesions/ulcers ?Neck: Supple, trachea midline ?Cardiovascular: RRR, +S1, S2, no m/g/r, equal pulses throughout ?Respiratory: CTABL, no w/r/r, normal WOB ?GI: BS+, NDNT, no masses noted, no organomegaly noted ?MSK: No e/c/c ?Neuro: A&O x 3, no focal deficits ?Psyc: Appropriate interaction and affect, calm/cooperative ? ?Data Reviewed: ? ?SCr 2.50  -> 1.46 ?CPK  619 -> 1086 ?WBC  19.8  -> 15.2 ? ?Assessment and Plan: ?No notes have been filed under this hospital service. ?Service: Hospitalist ?AKI ?Dehydration ?    - placed in obs, med-surg ?    - continue fluids, trend labs ?    - renal 30 ordered ?    - denies any supplement use ?    - has some protein in his urine, check protein/creatinine ratio ? ?Rhabdomyolysis ?Elevated LFTs ?    - fluids, trend CPK ?    - LFT elevation is  mild and likely secondary to CPK elevation, trend ? ?Leukocytosis ?    - no fevers, likely reactive ?    - UA is negative got leuk esterase and nitrites ?    - no respiratory symptoms, no fevers ? ?HTN? ?    - no previous diagnosis ?    - will have PRN meds available.  ? ?Advance Care Planning:   Code Status: FULL ? ?Consults: None ? ?Family Communication: w/ friend at bedside ? ?Severity of Illness: ?The appropriate patient status for this patient is INPATIENT. Inpatient status is judged to be reasonable and necessary in order to provide the required intensity of service to ensure the  patient's safety. The patient's presenting symptoms, physical exam findings, and initial radiographic and laboratory data in the context of their chronic comorbidities is felt to place them at high risk for further clinical deterioration. Furthermore, it is not anticipated that the patient will be medically stable for discharge from the hospital within 2 midnights of admission.  ? ?* I certify that at the point of admission it is my clinical judgment that the patient will require inpatient hospital care spanning beyond 2 midnights from the point of admission due to high intensity of service, high risk for further deterioration and high frequency of surveillance required.* ? ?Author: ?Teddy Spike, DO ?08/13/2021 2:12 PM ? ?For on call review [email or web address].  ?

## 2021-08-13 NOTE — Progress Notes (Signed)
Transition of Care (TOC) Screening Note ? ?Patient Details  ?Name: Shaun Freeman ?Date of Birth: 12/30/1995 ? ?Transition of Care (TOC) CM/SW Contact:    ?Sherie Don, LCSW ?Phone Number: ?08/13/2021, 1:12 PM ? ?Transition of Care Department [facility]) has reviewed patient and no TOC needs have been identified at this time. We will continue to monitor patient advancement through interdisciplinary progression rounds. If new patient transition needs arise, please place a TOC consult. ?

## 2021-08-13 NOTE — ED Notes (Signed)
Patient's mother to bedside and updated on plan of care per patient request.   ?

## 2021-08-13 NOTE — ED Notes (Signed)
Attempt to call report to unit, receiving nurse not available at this time, left message to call back ?

## 2021-08-13 NOTE — ED Notes (Signed)
Called Bed Placement and s/w Cathy to request status of bed - Was advised that there are no beds available at this time.  Advised charge nurse of status. ?

## 2021-08-14 DIAGNOSIS — M6282 Rhabdomyolysis: Secondary | ICD-10-CM

## 2021-08-14 DIAGNOSIS — R7989 Other specified abnormal findings of blood chemistry: Secondary | ICD-10-CM

## 2021-08-14 DIAGNOSIS — D72829 Elevated white blood cell count, unspecified: Secondary | ICD-10-CM

## 2021-08-14 DIAGNOSIS — E860 Dehydration: Secondary | ICD-10-CM

## 2021-08-14 DIAGNOSIS — E669 Obesity, unspecified: Secondary | ICD-10-CM

## 2021-08-14 LAB — COMPREHENSIVE METABOLIC PANEL WITH GFR
ALT: 37 U/L (ref 0–44)
AST: 46 U/L — ABNORMAL HIGH (ref 15–41)
Albumin: 3.8 g/dL (ref 3.5–5.0)
Alkaline Phosphatase: 41 U/L (ref 38–126)
Anion gap: 7 (ref 5–15)
BUN: 15 mg/dL (ref 6–20)
CO2: 26 mmol/L (ref 22–32)
Calcium: 9.1 mg/dL (ref 8.9–10.3)
Chloride: 106 mmol/L (ref 98–111)
Creatinine, Ser: 1.28 mg/dL — ABNORMAL HIGH (ref 0.61–1.24)
GFR, Estimated: >60 mL/min
Glucose, Bld: 106 mg/dL — ABNORMAL HIGH (ref 70–99)
Potassium: 4.1 mmol/L (ref 3.5–5.1)
Sodium: 139 mmol/L (ref 135–145)
Total Bilirubin: 0.6 mg/dL (ref 0.3–1.2)
Total Protein: 7 g/dL (ref 6.5–8.1)

## 2021-08-14 LAB — CBC
HCT: 38.6 % — ABNORMAL LOW (ref 39.0–52.0)
Hemoglobin: 12.9 g/dL — ABNORMAL LOW (ref 13.0–17.0)
MCH: 30.4 pg (ref 26.0–34.0)
MCHC: 33.4 g/dL (ref 30.0–36.0)
MCV: 91 fL (ref 80.0–100.0)
Platelets: 222 10*3/uL (ref 150–400)
RBC: 4.24 MIL/uL (ref 4.22–5.81)
RDW: 13.2 % (ref 11.5–15.5)
WBC: 12 10*3/uL — ABNORMAL HIGH (ref 4.0–10.5)
nRBC: 0 % (ref 0.0–0.2)

## 2021-08-14 LAB — HIV ANTIBODY (ROUTINE TESTING W REFLEX): HIV Screen 4th Generation wRfx: NONREACTIVE

## 2021-08-14 LAB — CK: Total CK: 977 U/L — ABNORMAL HIGH (ref 49–397)

## 2021-08-14 NOTE — Plan of Care (Signed)
  Problem: Clinical Measurements: Goal: Ability to maintain clinical measurements within normal limits will improve Outcome: Progressing   

## 2021-08-14 NOTE — Plan of Care (Signed)
Discharge instructions given to the patient including medications. ?

## 2021-08-14 NOTE — Discharge Summary (Signed)
Physician Discharge Summary  ?Caylor Deblasio [id number] DOB: Jun 12, 1995 DOA: 08/12/2021 ? ?PCP: Patient, No Pcp Per (Inactive) ? ?Admit date: 08/12/2021 ?Discharge date: 08/14/2021 ? ?Admitted From: Home ?Disposition: Home ? ?Recommendations for Outpatient Follow-up:  ?Follow up in ED if symptoms worsen or new appear ? ? ?Home Health: No ?Equipment/Devices: None ? ?Discharge Condition: Stable ?CODE STATUS: Full ?Diet recommendation: Regular ? ?Brief/Interim Summary: ?26 year old male with no past medical history presented with muscle cramping after recent heavy exercise.  On presentation, creatinine was 2.5, CK was 1086 with elevated WBCs.  He was started on IV fluids.  During the hospitalization, his condition has improved with much improved renal function, decreasing CK and WBCs.  He is adamant that he wants to go home today and denies any muscle pain.  He will be discharged home today. ? ?Discharge Diagnoses:  ? ?AKI ?Dehydration ?Rhabdomyolysis ?-Presented with muscle cramps after recent heavy exercise. ?-On presentation, creatinine was 2.5, CK was 1086 with elevated WBCs.  He was started on IV fluids.  During the hospitalization, his condition has improved: Creatinine 1.28 today.  CK total of 977. ?-Renal ultrasound was negative for hydronephrosis ?-Tolerating diet and denies any current muscle pain.  He is adamant that he wants to go home today.  He will be discharged home today.  Encouraged the patient to focus on oral hydration on discharge.  Avoid strenuous exercise for now. ? ?Leukocytosis ?-Possibly reactive.  Improving ? ?Mildly elevated LFTs ?-Possibly from above.  Improving ? ?Elevated blood pressures ?-Possibly from above ?-No diagnosis of hypertension.  Blood pressure still on the high side.  Will need outpatient follow-up. ? ?Obesity ?-Outpatient follow-up ? ?Discharge Instructions ? ?Discharge Instructions   ? ? Diet general   Complete by: As directed ?  ? Increase activity slowly   Complete  by: As directed ?  ? ?  ? ?Allergies as of 08/14/2021   ?No Known Allergies ?  ? ?  ?Medication List  ?  ? ?STOP taking these medications   ? ?acetaminophen 500 MG tablet ?Commonly known as: TYLENOL ?  ? ?  ? ? ?No Known Allergies ? ?Consultations: ?None ? ? ?Procedures/Studies: ?US RENAL ? ?Result Date: 08/13/2021 ?CLINICAL DATA:  AKI EXAM: RENAL / URINARY TRACT ULTRASOUND COMPLETE COMPARISON:  None. FINDINGS: Exam limited by overlying bowel gas. Right Kidney: Renal measurements: 10.1 x 4.7 x 5.5 cm = volume: 137 mL. Echogenicity within normal limits. No mass or hydronephrosis visualized. Left Kidney: Renal measurements: 9.0 x 4.6 x 5.7 cm = volume: 119 mL. Echogenicity within normal limits. No mass or hydronephrosis visualized. Bladder: Appears normal for degree of bladder distention. Other: None. IMPRESSION: Normal sonographic appearance of the kidneys and urinary bladder within limits of the exam. Electronically Signed   By: Albin Felling M.D.   On: 08/13/2021 16:58   ? ? ? ?Subjective: ?Patient seen and examined at bedside.  Denies any fever, nausea, vomiting.  Is adamant that he wants to go home today.  Denies worsening muscle cramps.  Tolerating diet. ? ?Discharge Exam: ?Vitals:  ? 08/14/21 0116 08/14/21 0617  ?BP: (!) 157/86 (!) 150/87  ?Pulse: (!) 58 66  ?Resp: 17 17  ?Temp: 97.8 ?F (36.6 ?C) (!) 97.4 ?F (36.3 ?C)  ?SpO2: 100% 100%  ? ? ?General: Pt is alert, awake, not in acute distress ?Cardiovascular: rate controlled, S1/S2 + ?Respiratory: bilateral decreased breath sounds at bases ?Abdominal: Soft, obese, NT, ND, bowel sounds + ?Extremities: no edema, no cyanosis ? ? ? ?The  results of significant diagnostics from this hospitalization (including imaging, microbiology, ancillary and laboratory) are listed below for reference.   ? ? ?Microbiology: ?No results found for this or any previous visit (from the past 240 hour(s)).  ? ?Labs: ?BNP (last 3 results) ?No results for input(s): BNP in the last 8760  hours. ?Basic Metabolic Panel: ?Recent Labs  ?Lab 08/12/21 ?2135 08/13/21 ?1453 08/14/21 ?0344  ?NA 137 137 139  ?K 4.7 4.0 4.1  ?CL 102 106 106  ?CO2 23 24 26   ?GLUCOSE 128* 99 106*  ?BUN 21* 19 15  ?CREATININE 2.50* 1.46* 1.28*  ?CALCIUM 10.6* 9.3 9.1  ?MG 1.8  --   --   ? ?Liver Function Tests: ?Recent Labs  ?Lab 08/12/21 ?2135 08/13/21 ?1453 08/14/21 ?0344  ?AST 45* 48* 46*  ?ALT 41 40 37  ?ALKPHOS 53 43 41  ?BILITOT 0.6 0.7 0.6  ?PROT 9.2* 7.8 7.0  ?ALBUMIN 5.1* 4.3 3.8  ? ?No results for input(s): LIPASE, AMYLASE in the last 168 hours. ?No results for input(s): AMMONIA in the last 168 hours. ?CBC: ?Recent Labs  ?Lab 08/12/21 ?2135 08/13/21 ?1453 08/14/21 ?0344  ?WBC 19.8* 15.2* 12.0*  ?NEUTROABS 16.1* 8.9*  --   ?HGB 15.2 13.5 12.9*  ?HCT 45.2 40.9 38.6*  ?MCV 87.3 90.7 91.0  ?PLT 300 247 222  ? ?Cardiac Enzymes: ?Recent Labs  ?Lab 08/12/21 ?2135 08/13/21 ?1453 08/14/21 ?0344  ?CKTOTAL 619* 1,086* 977*  ? ?BNP: ?Invalid input(s): POCBNP ?CBG: ?No results for input(s): GLUCAP in the last 168 hours. ?D-Dimer ?No results for input(s): DDIMER in the last 72 hours. ?Hgb A1c ?No results for input(s): HGBA1C in the last 72 hours. ?Lipid Profile ?No results for input(s): CHOL, HDL, LDLCALC, TRIG, CHOLHDL, LDLDIRECT in the last 72 hours. ?Thyroid function studies ?No results for input(s): TSH, T4TOTAL, T3FREE, THYROIDAB in the last 72 hours. ? ?Invalid input(s): FREET3 ?Anemia work up ?No results for input(s): VITAMINB12, FOLATE, FERRITIN, TIBC, IRON, RETICCTPCT in the last 72 hours. ?Urinalysis ?   ?Component Value Date/Time  ? [place] YELLOW 08/13/2021 0058  ? APPEARANCEUR CLEAR 08/13/2021 0058  ? LABSPEC 1.020 08/13/2021 0058  ? PHURINE 5.5 08/13/2021 0058  ? GLUCOSEU NEGATIVE 08/13/2021 0058  ? HGBUR TRACE (A) 08/13/2021 0058  ? [place] NEGATIVE 08/13/2021 0058  ? KETONESUR 40 (A) 08/13/2021 0058  ? PROTEINUR 100 (A) 08/13/2021 0058  ? NITRITE NEGATIVE 08/13/2021 0058  ? LEUKOCYTESUR NEGATIVE 08/13/2021 0058   ? ?Sepsis Labs ?Invalid input(s): PROCALCITONIN,  WBC,  LACTICIDVEN ?Microbiology ?No results found for this or any previous visit (from the past 240 hour(s)). ? ? ?Time coordinating discharge: 35 minutes ? ?SIGNED: ? ? ?Aline August, MD  ?[facility] ?08/14/2021, 9:42 AM ? ? ?

## 2022-08-29 IMAGING — US US RENAL
1 series · 14 of 25 positions shown · non-contrast
Comparison: None.

CLINICAL DATA: ASDISTA

EXAM:
RENAL / URINARY TRACT ULTRASOUND COMPLETE

[Series 1: us renal · 14 of 59 slices shown]
[im 1/59]
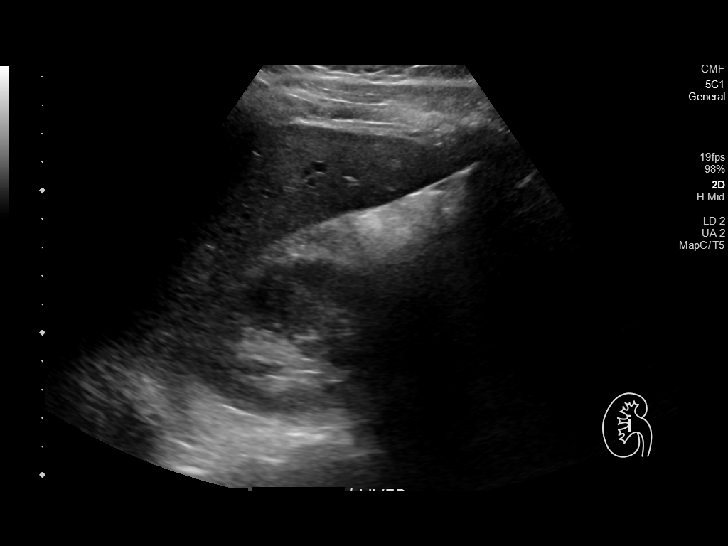
[im 5/59]
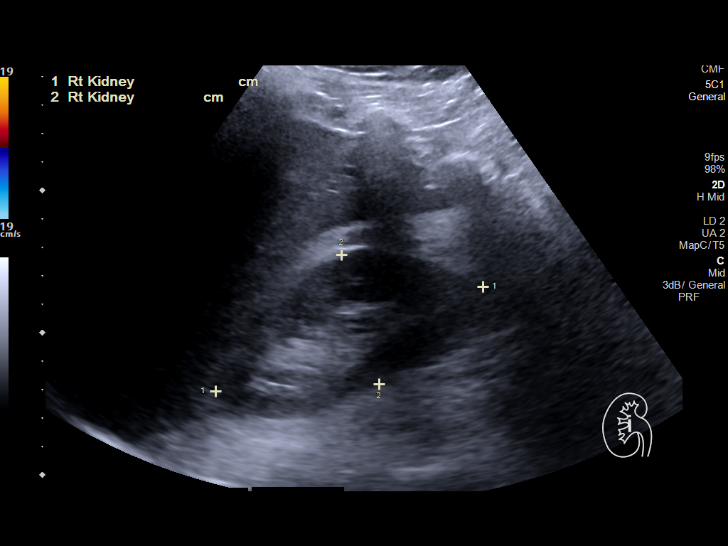
[im 10/59]
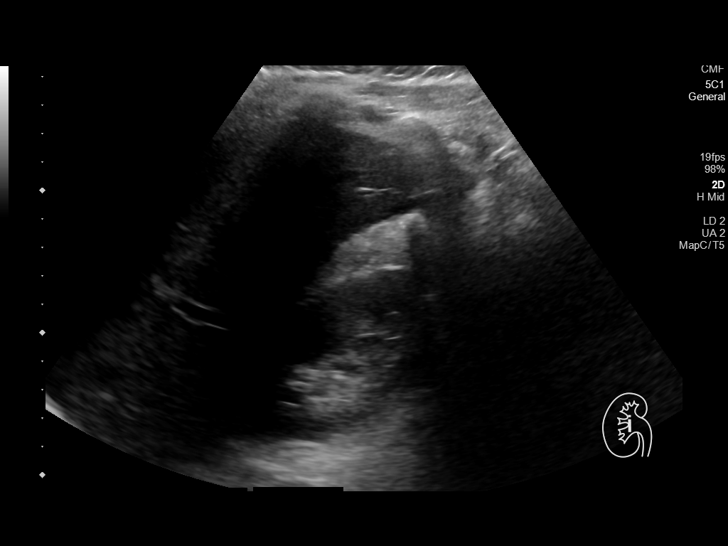
[im 15/59]
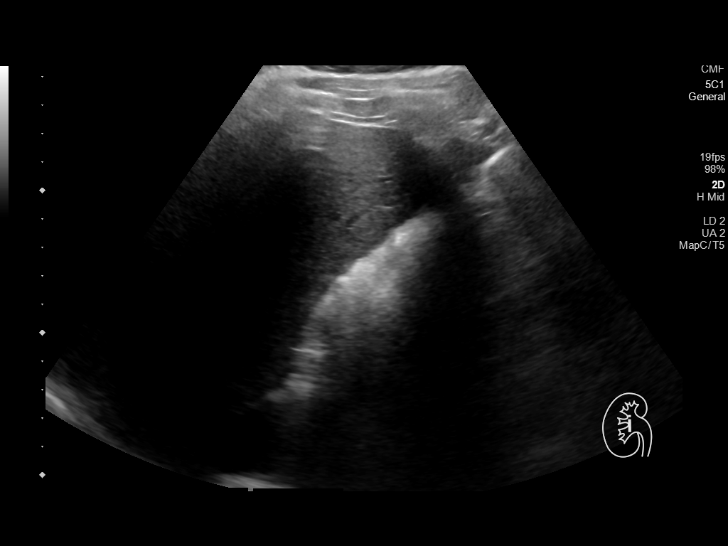
[im 20/59]
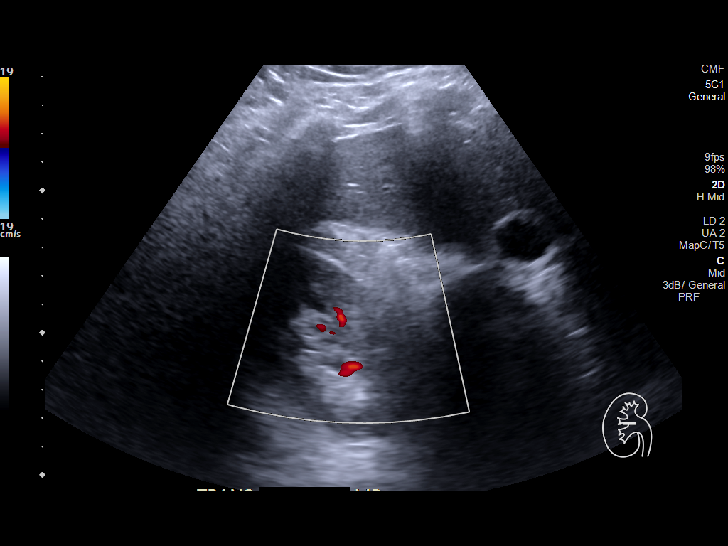
[im 22/59]
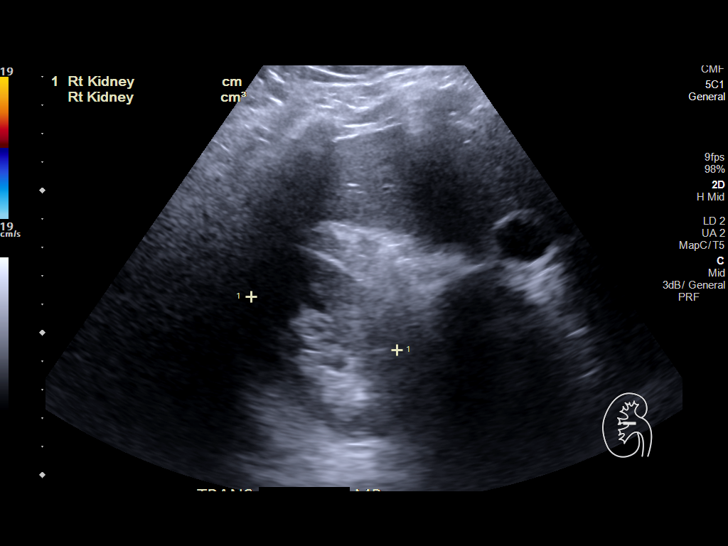
[im 27/59]
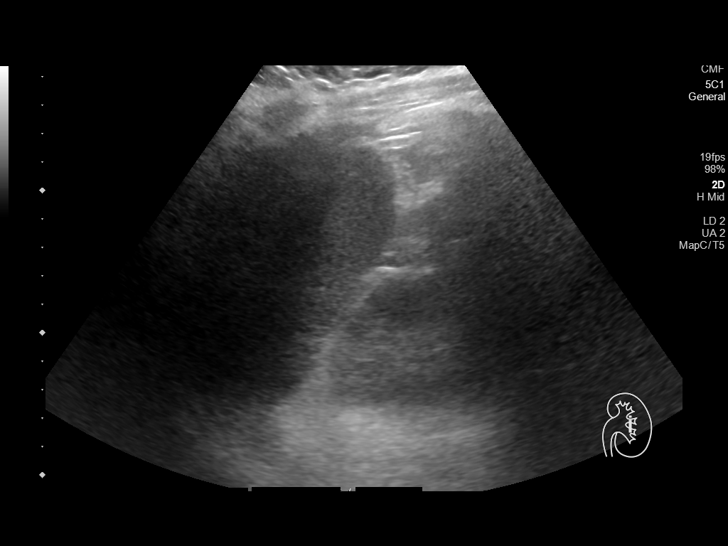
[im 32/59]
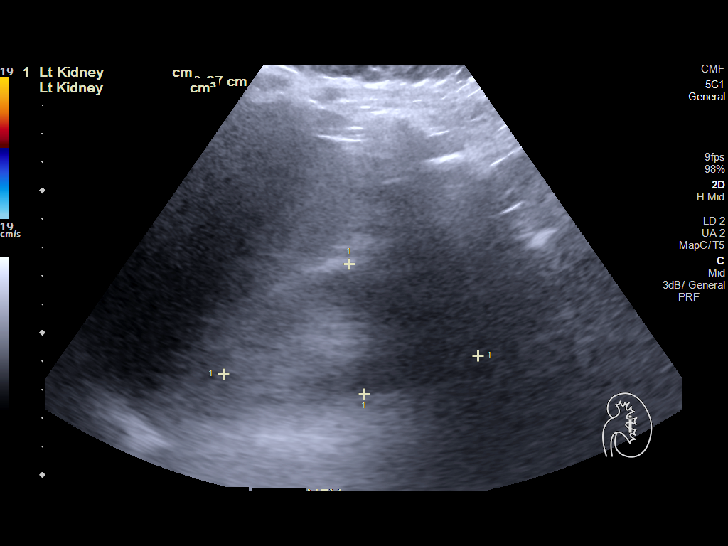
[im 37/59]
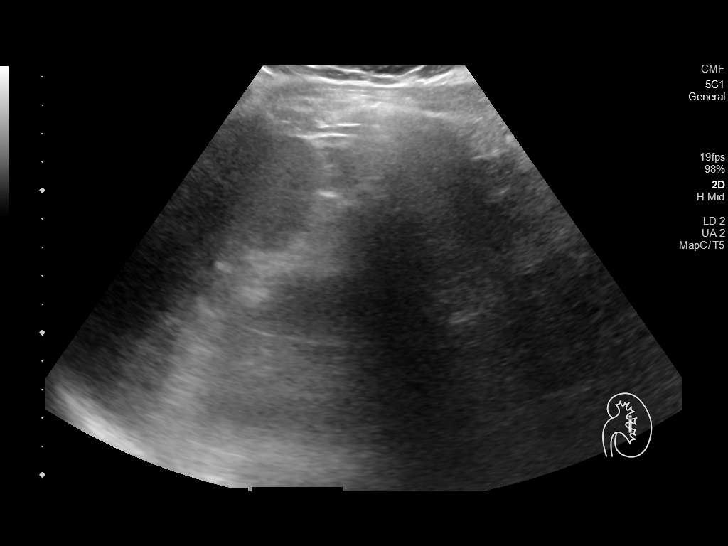
[im 39/59]
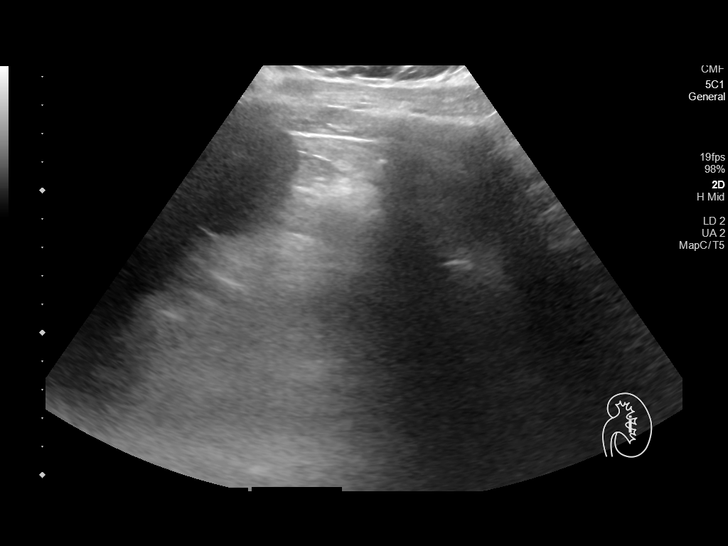
[im 44/59]
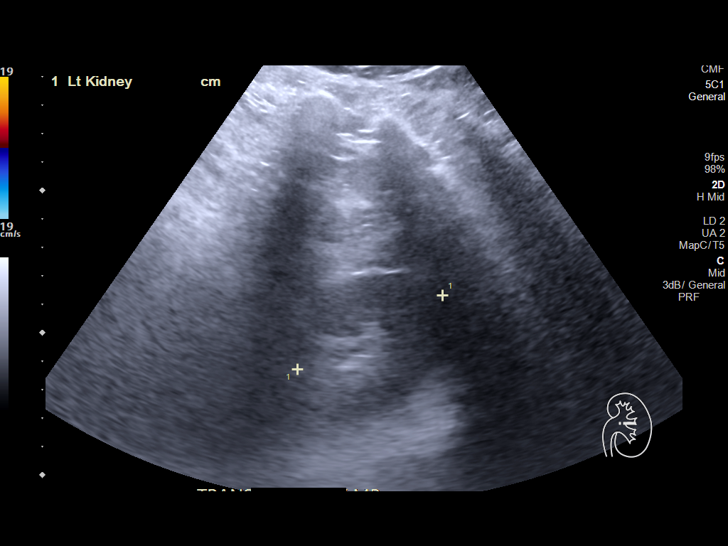
[im 49/59]
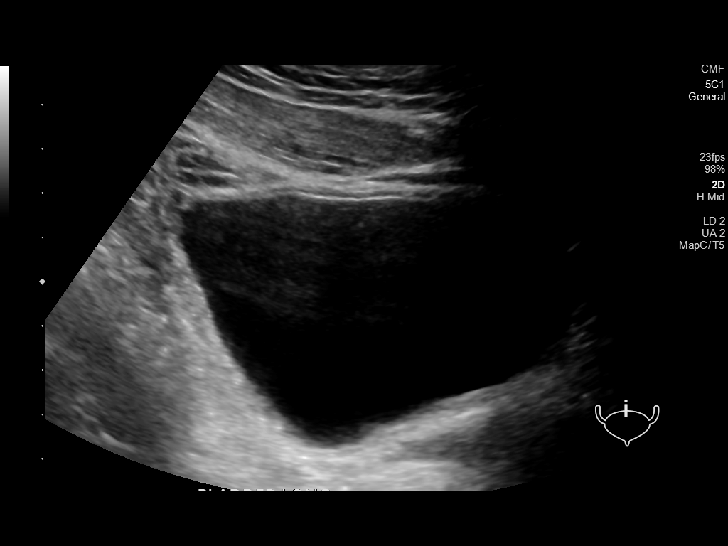
[im 54/59]
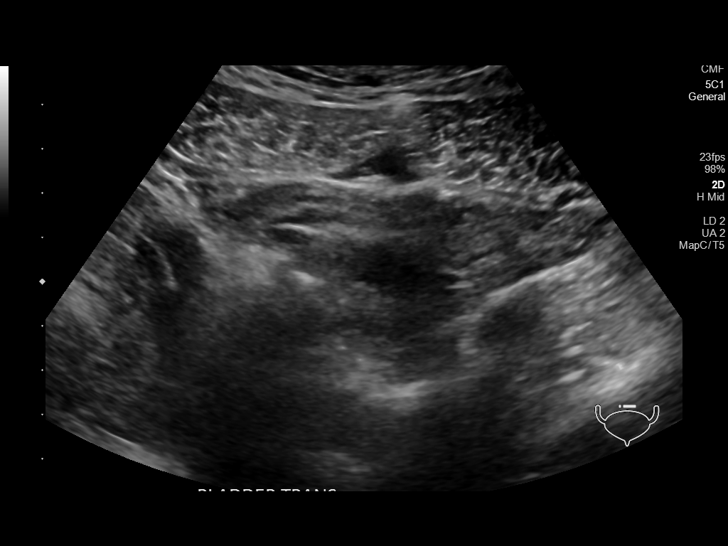
[im 59/59]
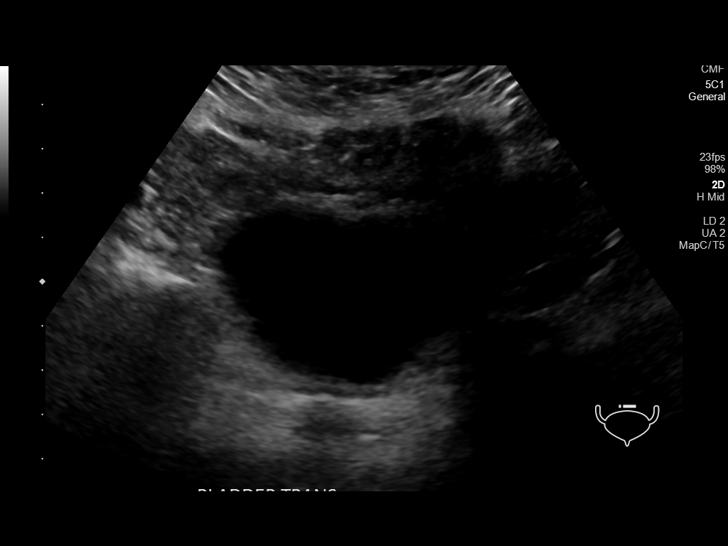

[14 of 25 positions shown; findings below may reference images not displayed]

FINDINGS: Exam limited by overlying bowel gas.

Right Kidney:

Renal measurements: 10.1 x 4.7 x 5.5 cm = volume: 137 mL.
Echogenicity within normal limits. No mass or hydronephrosis
visualized.

Left Kidney:

Renal measurements: 9.0 x 4.6 x 5.7 cm = volume: 119 mL.
Echogenicity within normal limits. No mass or hydronephrosis
visualized.

Bladder:

Appears normal for degree of bladder distention.

Other:

None.
IMPRESSION: Normal sonographic appearance of the kidneys and urinary bladder
within limits of the exam.
